# Patient Record
Sex: Male | Born: 1945 | Race: Black or African American | Hispanic: No | Marital: Single | Smoking: Current every day smoker
Health system: Southern US, Community
[De-identification: ages and names within clinical notes are randomized; demographics above are authoritative.]

## PROBLEM LIST (undated history)

## (undated) DIAGNOSIS — I10 Essential (primary) hypertension: Secondary | ICD-10-CM

## (undated) DIAGNOSIS — N400 Enlarged prostate without lower urinary tract symptoms: Secondary | ICD-10-CM

---

## 2012-03-26 ENCOUNTER — Encounter (HOSPITAL_COMMUNITY): Payer: Self-pay | Admitting: Pharmacy Technician

## 2012-03-30 NOTE — Patient Instructions (Addendum)
[address] Zeferino Mounts  03/30/2012   Your procedure is scheduled on:  04/07/12  Report to [facility] at 10:20 AM.  Call this number if you have problems the morning of surgery: [phone]   Remember:   Do not eat or drink:After Midnight.  Take these medicines the morning of surgery with A SIP OF WATER: Atenolol and Lisinopril   Do not wear jewelry, make-up or nail polish.  Do not wear lotions, powders, or perfumes.   Do not shave 48 hours prior to surgery. Men may shave face and neck.  Do not bring valuables to the hospital.  Contacts, dentures or bridgework may not be worn into surgery.  Leave suitcase in the car. After surgery it may be brought to your room.   Patients discharged the day of surgery will not be allowed to drive home.   Special Instructions: Shower using CHG 2 nights before surgery and the night before surgery.  If you shower the day of surgery use CHG.  Use special wash - you have one bottle of CHG for all showers.  You should use approximately 1/3 of the bottle for each shower.   Please read over the following fact sheets that you were given: Pain Booklet, MRSA Information, Surgical Site Infection Prevention, Anesthesia Post-op Instructions and Care and Recovery After Surgery    Transurethral Resection of the Prostate Transurethral Resection of the Prostate (TURP) is often treatment for non-cancerous (benign) prostatic hyperplasia (BPH) or prostate cancer. BPH commonly begins in middle aged men and can cause symptoms at any age thereafter. The complications that stem from these problems, such as recurrent infection and bladder control and emptying problems, are often helped by this procedure. Both conditions usually cause the prostate to increase in size. TURP is a major surgery that removes part of the prostate gland. The goal is to remove enough prostate to allow for unobstructed flow of urine. TREATMENT This surgery (TURP procedure) is done using an instrument like a narrow  telescope to look into your bladder. This is then used to remove enlarged pieces of your prostate, one piece at a time. This removes the blockage and makes it easier for you to urinate. This is called a transurethral resection of the prostate. A lesser procedure is sometimes done. In this procedure, small cuts are made in the prostate. This lessens the prostates pressure on the urethra. This is called a transurethral incision (cut by the surgeon) of the prostate (TUIP). You will probably be comfortable soon after the operation, but it will take 10 to 12 weeks, or longer, for your prostate to heal after you leave the hospital.  LET YOUR CAREGIVER KNOW ABOUT:  Allergies.  Medications taken including herbs, eye drops, over the counter medications, and creams.  Use of steroids (by mouth or creams).  Previous problems with anesthetics or Novocaine.  History of blood clots (thrombophlebitis).  History of bleeding or blood problems.  Previous surgery.  Previous prostate infections.  Other health problems. RISKS AND COMPLICATIONS  Rare injury to the bowel (intestine).  Rare injury to the bladder or adjacent blood vessels.  Intestinal/bowel obstruction.  Scarring called "stricture" that cause later problems with the flow of urine.  Bleeding and the need for blood transfusion (more common in those with prostate cancer and those who have previously received radiation therapy).  Inability to control your urine (incontinence). This is more common in those with prostate cancer and those who have received radiation therapy.  Injury to one of the ureters (  tubes that drain the kidneys into the bladder) and/or urethra (the tube that drains the bladder).  Injury to the capsule that holds the prostate. This can lead to leakage of fluid and urine into the belly (abdomen).  The operation can possibly lead to impotence. This is the inability to get an erection. Treatments are available for these  types of problems.  Rare over absorption of the fluids used during the operation. This can then cause low blood levels of sodium, brain swelling, strain on the heart, and fluid accumulation in the lungs. This is more common in long procedures.  Infection.  Blood clots in the legs.  As with any major surgery, there is always the rare chance of a complicating stroke, heart attack, or other complications that will be discussed with you by the surgeon and anesthesiologist. BEFORE THE PROCEDURE   You may be asked to temporarily adjust your diet. If so, your caregiver will give you specific recommendations.  If you are on blood thinners, stop taking them before the operation, or as your caregiver advises.  You should have nothing to eat or drink after midnight prior to your surgery or as suggested by your caregiver. You may have a sip of water to take medications not stopped for the procedure PROCEDURE  This operation is performed after you have been given a medication to help you sleep (anesthetic), or with a spinal block. The spinal block keeps you awake but numb from the waist down.  No cut or incision is needed. During the operation, your surgeon passes a viewing and cutting instrument (resectoscope) through the penis into the prostate gland. The instrument contains an electric cutting edge. From inside the prostate, this cutter is used to remove part of the prostate.  HOME CARE INSTRUCTIONS  For your own protection, observe the following precautions for 10 days after your operation.  You may go home with a catheter. Take care of it as directed. You will receive instruction on catheter care.  After catheter removal, empty the bladder whenever you feel a definite desire. Do not try to hold the urine for long periods of time.  For 10 days, avoid all lifting, straining, running, strenuous work, walks longer than a couple blocks, riding in a car for extended periods, and sexual relations.  Take  2 tablespoons of heavy mineral oil or Metamucil night and morning for 3 or 4 days. After that, gradually reduce the dose to one or two teaspoons twice daily. Stop it after the stools have been normal for a week. If you become constipated, do not strain to move your bowels. You may use an enema. Notify your caregiver about problems.  Even after complete healing, you may continue to urinate once or twice during the night.  In addition to your usual medications, you may be given an antibiotic to take for 10-14 days. Notify your caregiver if you have any side effects or problems with the medication.  Avoid alcohol and caffeinated drinks for 2 weeks, as they are irritating to the bladder. Decaffeinated drinks are fine.  Eat a regular diet, avoiding spicy foods for 2 weeks.  You may continue non-strenuous activities. It is always important to keep active after an operation. This lessens the chance of developing blood clots.  You may see some recurrence of blood in the urine after discharge from the hospital. Even a small amount of blood colors the urine very red. If this occurs, force fluids again as you did in the hospital. This  is generally not a concern. SEEK MEDICAL CARE IF:   You have chills or night sweats.  You are leaking around your catheter or have problems with your catheter.  You develop side effects that you think are coming from your medications. SEEK IMMEDIATE MEDICAL CARE IF:   You are suddenly unable to urinate. This is an emergency.  You develop shortness of breath or chest pains.  Bleeding persists or clots develop.  You have a fever.  You develop pain in your back or over your lower belly (abdomen).  You develop pain or swelling in your legs.  You develop swelling in your abdomen or have a sudden weight gain.  The problems get worse rather than better. Document Released: 05/20/2005 Document Revised: 08/12/2011 Document Reviewed: 10/21/2011 [facility] Patient  Information 2013 [place], [place].    PATIENT INSTRUCTIONS POST-ANESTHESIA  IMMEDIATELY FOLLOWING SURGERY:  Do not drive or operate machinery for the first twenty four hours after surgery.  Do not make any important decisions for twenty four hours after surgery or while taking narcotic pain medications or sedatives.  If you develop intractable nausea and vomiting or a severe headache please notify your doctor immediately.  FOLLOW-UP:  Please make an appointment with your surgeon as instructed. You do not need to follow up with anesthesia unless specifically instructed to do so.  WOUND CARE INSTRUCTIONS (if applicable):  Keep a dry clean dressing on the anesthesia/puncture wound site if there is drainage.  Once the wound has quit draining you may leave it open to air.  Generally you should leave the bandage intact for twenty four hours unless there is drainage.  If the epidural site drains for more than 36-48 hours please call the anesthesia department.  QUESTIONS?:  Please feel free to call your physician or the hospital operator if you have any questions, and they will be happy to assist you.

## 2012-03-31 ENCOUNTER — Encounter (HOSPITAL_COMMUNITY)
Admission: RE | Admit: 2012-03-31 | Discharge: 2012-03-31 | Disposition: A | Discharge status: Home / Self Care | Payer: Medicare Other | Source: Ambulatory Visit | Attending: Urology | Admitting: Urology

## 2012-03-31 ENCOUNTER — Encounter (HOSPITAL_COMMUNITY): Payer: Self-pay

## 2012-03-31 HISTORY — DX: Benign prostatic hyperplasia without lower urinary tract symptoms: N40.0

## 2012-03-31 HISTORY — DX: Essential (primary) hypertension: I10

## 2012-03-31 LAB — CBC WITH DIFFERENTIAL/PLATELET
Basophils Relative: 1 % (ref 0–1)
Eosinophils Absolute: 0.1 10*3/uL (ref 0.0–0.7)
MCH: 29.3 pg (ref 26.0–34.0)
MCHC: 33.7 g/dL (ref 30.0–36.0)
Monocytes Relative: 9 % (ref 3–12)
Neutrophils Relative %: 57 % (ref 43–77)
Platelets: 227 10*3/uL (ref 150–400)
RDW: 13.9 % (ref 11.5–15.5)

## 2012-03-31 LAB — BASIC METABOLIC PANEL
BUN: 18 mg/dL (ref 6–23)
GFR calc Af Amer: >90 mL/min (ref >90)
GFR calc non Af Amer: 85 mL/min — ABNORMAL LOW (ref >90)
Potassium: 4.1 mEq/L (ref 3.5–5.1)
Sodium: 139 mEq/L (ref 135–145)

## 2012-03-31 LAB — SURGICAL PCR SCREEN: MRSA, PCR: NEGATIVE

## 2012-04-06 NOTE — H&P (Signed)
[name], [name]               ACCOUNT NO.:  [id number]  MEDICAL RECORD NO.:  [id number]  LOCATION:                                 FACILITY:  PHYSICIAN:  Ky Barban, M.D.DATE OF BIRTH:  11-05-1945  DATE OF ADMISSION:  04/07/2012 DATE OF DISCHARGE:  [facility]                             HISTORY & PHYSICAL   CHIEF COMPLAINT:  Symptoms of prostatism.  HISTORY OF PRESENT ILLNESS:  A 66 year old gentleman who has voiding difficulty.  The patient's Dr. Rito Ehrlich who seen in March of this year. He has a history of having urethral stricture so I cystoscoped him in last month.  There is no recurrent of the urethral stricture, but he is very symptomatic.  He does have enlarged prostate for which I have advised him to undergo TUR prostate.  His residual urine was 62 mL. There is no history of dysuria, hematuria,  abdominal pain.  PAST MEDICAL HISTORY:  Includes hypertension, BPH with bladder neck obstruction, history of having urethral stricture.  PERSONAL HISTORY:  Does not smoke or drink.  REVIEW OF SYSTEMS:  Unremarkable.  PHYSICAL EXAMINATION:  GENERAL:  Moderately built male, not in acute distress. VITAL SIGNS:  Blood pressure is 120/80, temperature is normal. CENTRAL NERVOUS SYSTEM:  No gross neurological deficit. HEAD, NECK, EYES, ENT:  Negative. CHEST:  Symmetrical.  Normal breath sounds. HEART:  Regular sinus rhythm.  No murmur. ABDOMEN:  Soft, flat.  Liver, spleen, kidneys are not palpable.  No CVA tenderness. GU:  External genitalia is circumcised, meatus adequate.  Testicles are normal. RECTAL:  Normal sphincter tone.  No rectal mass.  Prostate 1.5+, smooth and firm.  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  The patient has been on Flomax 0.4 mg daily for some time, but he is not getting any better.  So I recommended undergo TUR prostate.  Procedure limitation, and complications were discussed.  He understands and want me to go  ahead and proceed.  He will be kept overnight in the hospital for observation.     Ky Barban, M.D.     MIJ/MEDQ  D:  04/06/2012  T:  04/06/2012  Job:  [id number]

## 2012-04-07 ENCOUNTER — Encounter (HOSPITAL_COMMUNITY): Payer: Self-pay | Admitting: Anesthesiology

## 2012-04-07 ENCOUNTER — Encounter (HOSPITAL_COMMUNITY): Payer: Self-pay | Admitting: *Deleted

## 2012-04-07 ENCOUNTER — Ambulatory Visit (HOSPITAL_COMMUNITY)
Admission: RE | Admit: 2012-04-07 | Discharge: 2012-04-08 | Disposition: A | Discharge status: Home / Self Care | Payer: Medicare Other | Source: Ambulatory Visit | Attending: Urology | Admitting: Urology

## 2012-04-07 ENCOUNTER — Ambulatory Visit (HOSPITAL_COMMUNITY): Payer: Medicare Other | Admitting: Anesthesiology

## 2012-04-07 ENCOUNTER — Encounter (HOSPITAL_COMMUNITY)
Admission: RE | Disposition: A | Discharge status: Home / Self Care | Payer: Self-pay | Source: Ambulatory Visit | Attending: Urology

## 2012-04-07 DIAGNOSIS — Z01810 Encounter for preprocedural cardiovascular examination: Secondary | ICD-10-CM | POA: Insufficient documentation

## 2012-04-07 DIAGNOSIS — N400 Enlarged prostate without lower urinary tract symptoms: Secondary | ICD-10-CM | POA: Insufficient documentation

## 2012-04-07 DIAGNOSIS — I10 Essential (primary) hypertension: Secondary | ICD-10-CM | POA: Insufficient documentation

## 2012-04-07 DIAGNOSIS — Z23 Encounter for immunization: Secondary | ICD-10-CM | POA: Insufficient documentation

## 2012-04-07 DIAGNOSIS — Z01812 Encounter for preprocedural laboratory examination: Secondary | ICD-10-CM | POA: Insufficient documentation

## 2012-04-07 HISTORY — PX: TRANSURETHRAL RESECTION OF PROSTATE: SHX73

## 2012-04-07 LAB — TYPE AND SCREEN

## 2012-04-07 SURGERY — TURP (TRANSURETHRAL RESECTION OF PROSTATE)
Anesthesia: Spinal | Site: Prostate | Wound class: Clean Contaminated

## 2012-04-07 MED ORDER — LACTATED RINGERS IV SOLN: INTRAVENOUS | Status: DC

## 2012-04-07 MED ORDER — ARTIFICIAL TEARS OP OINT
TOPICAL_OINTMENT | OPHTHALMIC | Status: AC
  Filled 2012-04-07: qty 3.5

## 2012-04-07 MED ORDER — LIDOCAINE HCL (PF) 1 % IJ SOLN
INTRAMUSCULAR | Status: AC
  Filled 2012-04-07: qty 5

## 2012-04-07 MED ORDER — STERILE WATER FOR IRRIGATION IR SOLN
Status: DC | PRN
  Administered 2012-04-07: 1000 mL

## 2012-04-07 MED ORDER — ONDANSETRON HCL 4 MG/2ML IJ SOLN: 4.0000 mg | Freq: Once | INTRAMUSCULAR | Status: DC | PRN

## 2012-04-07 MED ORDER — ATENOLOL 25 MG PO TABS
50.0000 mg | ORAL_TABLET | Freq: Every day | ORAL | Status: DC
  Administered 2012-04-08: 50 mg via ORAL
  Filled 2012-04-07: qty 2

## 2012-04-07 MED ORDER — MIDAZOLAM HCL 2 MG/2ML IJ SOLN
INTRAMUSCULAR | Status: AC
  Filled 2012-04-07: qty 2

## 2012-04-07 MED ORDER — HYDROMORPHONE HCL PF 1 MG/ML IJ SOLN: 1.0000 mg | INTRAMUSCULAR | Status: DC | PRN

## 2012-04-07 MED ORDER — PROPOFOL 10 MG/ML IV EMUL
INTRAVENOUS | Status: AC
  Filled 2012-04-07: qty 20

## 2012-04-07 MED ORDER — GLYCOPYRROLATE 0.2 MG/ML IJ SOLN
INTRAMUSCULAR | Status: DC | PRN
  Administered 2012-04-07: 0.2 mg via INTRAVENOUS

## 2012-04-07 MED ORDER — PROPOFOL INFUSION 10 MG/ML OPTIME
INTRAVENOUS | Status: DC | PRN
  Administered 2012-04-07: 75 ug/kg/min via INTRAVENOUS

## 2012-04-07 MED ORDER — FENTANYL CITRATE 0.05 MG/ML IJ SOLN
INTRAMUSCULAR | Status: DC | PRN
  Administered 2012-04-07 (×3): 25 ug via INTRAVENOUS
  Administered 2012-04-07: 25 ug via INTRATHECAL

## 2012-04-07 MED ORDER — BUPIVACAINE IN DEXTROSE 0.75-8.25 % IT SOLN
INTRATHECAL | Status: AC
  Filled 2012-04-07: qty 2

## 2012-04-07 MED ORDER — BUPIVACAINE HCL (PF) 0.75 % IJ SOLN
INTRAMUSCULAR | Status: DC | PRN
  Administered 2012-04-07: 2 mL

## 2012-04-07 MED ORDER — PNEUMOCOCCAL VAC POLYVALENT 25 MCG/0.5ML IJ INJ
0.5000 mL | INJECTION | INTRAMUSCULAR | Status: AC
  Administered 2012-04-08: 0.5 mL via INTRAMUSCULAR
  Filled 2012-04-07: qty 0.5

## 2012-04-07 MED ORDER — PHENYLEPHRINE HCL 10 MG/ML IJ SOLN
INTRAMUSCULAR | Status: DC | PRN
  Administered 2012-04-07: 100 ug via INTRAVENOUS

## 2012-04-07 MED ORDER — EPHEDRINE SULFATE 50 MG/ML IJ SOLN
INTRAMUSCULAR | Status: DC | PRN
  Administered 2012-04-07: 10 mg via INTRAVENOUS
  Administered 2012-04-07: 15 mg via INTRAVENOUS
  Administered 2012-04-07: 10 mg via INTRAVENOUS

## 2012-04-07 MED ORDER — ATROPINE SULFATE 0.4 MG/ML IJ SOLN
INTRAMUSCULAR | Status: AC
  Filled 2012-04-07: qty 1

## 2012-04-07 MED ORDER — ONDANSETRON HCL 4 MG/2ML IJ SOLN
INTRAMUSCULAR | Status: AC
  Filled 2012-04-07: qty 2

## 2012-04-07 MED ORDER — PHENYLEPHRINE HCL 10 MG/ML IJ SOLN
INTRAMUSCULAR | Status: AC
  Filled 2012-04-07: qty 1

## 2012-04-07 MED ORDER — ONDANSETRON HCL 4 MG/2ML IJ SOLN
4.0000 mg | Freq: Once | INTRAMUSCULAR | Status: AC
  Administered 2012-04-07: 4 mg via INTRAVENOUS

## 2012-04-07 MED ORDER — DEXTROSE-NACL 5-0.45 % IV SOLN
INTRAVENOUS | Status: DC
  Administered 2012-04-07 – 2012-04-08 (×3): via INTRAVENOUS

## 2012-04-07 MED ORDER — FENTANYL CITRATE 0.05 MG/ML IJ SOLN
INTRAMUSCULAR | Status: AC
  Filled 2012-04-07: qty 2

## 2012-04-07 MED ORDER — LACTATED RINGERS IV SOLN
INTRAVENOUS | Status: DC | PRN
  Administered 2012-04-07 (×3): via INTRAVENOUS
  Administered 2012-04-07: 1000 mL

## 2012-04-07 MED ORDER — SODIUM CHLORIDE 0.9 % IR SOLN
Status: DC | PRN
  Administered 2012-04-07: 3000 mL

## 2012-04-07 MED ORDER — FENTANYL CITRATE 0.05 MG/ML IJ SOLN: 25.0000 ug | INTRAMUSCULAR | Status: DC | PRN

## 2012-04-07 MED ORDER — LIDOCAINE HCL (CARDIAC) 10 MG/ML IV SOLN
INTRAVENOUS | Status: DC | PRN
  Administered 2012-04-07: 50 mg via INTRAVENOUS

## 2012-04-07 MED ORDER — GLYCINE 1.5 % IR SOLN
Status: DC | PRN
  Administered 2012-04-07 (×14): 3000 mL

## 2012-04-07 MED ORDER — MIDAZOLAM HCL 2 MG/2ML IJ SOLN
1.0000 mg | INTRAMUSCULAR | Status: DC | PRN
  Administered 2012-04-07: 2 mg via INTRAVENOUS

## 2012-04-07 MED ORDER — EPHEDRINE SULFATE 50 MG/ML IJ SOLN
INTRAMUSCULAR | Status: AC
  Filled 2012-04-07: qty 1

## 2012-04-07 SURGICAL SUPPLY — 30 items
BAG DECANTER FOR FLEXI CONT (MISCELLANEOUS) ×2 IMPLANT
BAG DRAIN URO TABLE W/ADPT NS (DRAPE) ×2 IMPLANT
BAG URINE DRAIN TURP 4L (OSTOMY) ×2 IMPLANT
CABLE HI FREQUENCY MONOPOLAR (ELECTROSURGICAL) ×4 IMPLANT
CATH FOLEY 3WAY 30CC 22F (CATHETERS) ×2 IMPLANT
CLOTH BEACON ORANGE TIMEOUT ST (SAFETY) ×2 IMPLANT
CONNECTOR 5 IN 1 STRAIGHT STRL (MISCELLANEOUS) ×2 IMPLANT
DRAPE STERI URO 23X35 APER SZ5 (DRAPE) ×2 IMPLANT
ELECT CUT LOOP C-MAX 27FR .012 (CUTTING LOOP) ×4
ELECT REM PT RETURN 9FT ADLT (ELECTROSURGICAL) ×2
ELECTRODE CUT LP CMX 27FR .012 (CUTTING LOOP) ×2 IMPLANT
ELECTRODE REM PT RTRN 9FT ADLT (ELECTROSURGICAL) ×1 IMPLANT
FLOOR PAD 36X40 (MISCELLANEOUS)
FORMALIN 10 PREFIL 480ML (MISCELLANEOUS) ×2 IMPLANT
GLOVE BIO SURGEON STRL SZ7 (GLOVE) ×2 IMPLANT
GLYCINE 1.5% IRRIG UROMATIC (IV SOLUTION) ×28 IMPLANT
GOWN STRL REIN XL XLG (GOWN DISPOSABLE) ×4 IMPLANT
IV NS IRRIG 3000ML ARTHROMATIC (IV SOLUTION) ×2 IMPLANT
KIT ROOM TURNOVER AP CYSTO (KITS) ×2 IMPLANT
MANIFOLD NEPTUNE II (INSTRUMENTS) ×2 IMPLANT
PACK CYSTO (CUSTOM PROCEDURE TRAY) ×2 IMPLANT
PAD ARMBOARD 7.5X6 YLW CONV (MISCELLANEOUS) ×2 IMPLANT
PAD FLOOR 36X40 (MISCELLANEOUS) IMPLANT
SET IRRIGATING DISP (SET/KITS/TRAYS/PACK) ×2 IMPLANT
SYR 30ML LL (SYRINGE) ×2 IMPLANT
SYRINGE IRR TOOMEY STRL 70CC (SYRINGE) ×2 IMPLANT
TOWEL OR 17X26 4PK STRL BLUE (TOWEL DISPOSABLE) ×2 IMPLANT
WATER STERILE IRR 1000ML POUR (IV SOLUTION) ×2 IMPLANT
XPEEDA 550 SIDEFIRING FIBER (MISCELLANEOUS) IMPLANT
YANKAUER SUCT BULB TIP 10FT TU (MISCELLANEOUS) ×2 IMPLANT

## 2012-04-07 NOTE — Anesthesia Postprocedure Evaluation (Addendum)
  Anesthesia Post-op Note  Patient: Anthony Goodwin  Procedure(s) Performed: Procedure(s) (LRB) with comments: TRANSURETHRAL RESECTION OF THE PROSTATE (TURP) (N/A)  Patient Location: PACU  Anesthesia Type: Spinal  Level of Consciousness: awake, alert , oriented and patient cooperative  Airway and Oxygen Therapy: Patient Spontanous Breathing room air  Post-op Pain: mild  Post-op Assessment: Post-op Vital signs reviewed, Patient's Cardiovascular Status Stable, Respiratory Function Stable, Patent Airway and No signs of Nausea or vomiting  Post-op Vital Signs: Reviewed and stable  Complications: No apparent anesthesia complications  04/08/12  Patient doing well.  VSS.  No HA or backache.  No apparent anesthesia complications.

## 2012-04-07 NOTE — Transfer of Care (Signed)
  Anesthesia Post-op Note  Patient: Anthony Goodwin  Procedure(s) Performed: Procedure(s) (LRB) with comments: TRANSURETHRAL RESECTION OF THE PROSTATE (TURP) (N/A)  Patient Location: PACU  Anesthesia Type:spinal  Level of Consciousness: awake, alert , oriented and patient cooperative  Airway and Oxygen Therapy: Patient Spontanous Breathing on room air Post-op Pain: mild  Post-op Assessment: Post-op Vital signs reviewed, Patient's Cardiovascular Status Stable, Respiratory Function Stable, Patent Airway and No signs of Nausea or vomiting  Post-op Vital Signs: Reviewed and stable  Complications: No apparent anesthesia complications

## 2012-04-07 NOTE — Preoperative (Signed)
Beta Blockers   Reason not to administer Beta Blockers:Not Applicable 

## 2012-04-07 NOTE — Progress Notes (Signed)
Awake. Coke given to drink. Tolerated well. 

## 2012-04-07 NOTE — Anesthesia Preprocedure Evaluation (Addendum)
Anesthesia Evaluation  Patient identified by MRN, date of birth, ID band Patient awake    Reviewed: Allergy & Precautions, H&P , NPO status , Patient's Chart, lab work & pertinent test results, reviewed documented beta blocker date and time   History of Anesthesia Complications Negative for: history of anesthetic complications  Airway Mallampati: I TM Distance: >3 FB     Dental  (+) Edentulous Upper and Edentulous Lower   Pulmonary Current Smoker,  breath sounds clear to auscultation        Cardiovascular hypertension, Pt. on medications     Neuro/Psych    GI/Hepatic negative GI ROS,   Endo/Other    Renal/GU      Musculoskeletal   Abdominal   Peds  Hematology   Anesthesia Other Findings   Reproductive/Obstetrics                          Anesthesia Physical Anesthesia Plan  ASA: II  Anesthesia Plan: Spinal   Post-op Pain Management:    Induction: Intravenous  Airway Management Planned: Nasal Cannula  Additional Equipment:   Intra-op Plan:   Post-operative Plan:   Informed Consent: I have reviewed the patients History and Physical, chart, labs and discussed the procedure including the risks, benefits and alternatives for the proposed anesthesia with the patient or authorized representative who has indicated his/her understanding and acceptance.     Plan Discussed with:   Anesthesia Plan Comments:         Anesthesia Quick Evaluation

## 2012-04-07 NOTE — Plan of Care (Signed)
Problem: Phase I Progression Outcomes Goal: Initial discharge plan identified Outcome: Completed/Met Date Met:  04/07/12 Plans to discharge home.

## 2012-04-07 NOTE — Progress Notes (Signed)
Temp 97.4 oral. bair hugger d/c'd. bair paws d/c'd. Warm blankets reapplied.

## 2012-04-07 NOTE — Progress Notes (Signed)
Pt condition called to care giver, Mrs. Amie Critchley. Voiced understanding.

## 2012-04-07 NOTE — Brief Op Note (Signed)
04/07/2012  1:56 PM  PATIENT:  Anthony Goodwin  66 y.o. male  PRE-OPERATIVE DIAGNOSIS:  benign prostate hypertrophy  POST-OPERATIVE DIAGNOSIS:  benign prostate hypertrophy  PROCEDURE:  Procedure(s) (LRB) with comments: TRANSURETHRAL RESECTION OF THE PROSTATE (TURP) (N/A)  SURGEON:  Surgeon(s) and Role:    * Ky Barban, MD - Primary  PHYSICIAN ASSISTANT:   ASSISTANTS: none   ANESTHESIA:   spinal  EBL:  Total I/O In: 1500 [I.V.:1500] Out: -   BLOOD ADMINISTERED:noblood given CC PRBC  DRAINS: Urinary Catheter (Foley)   LOCAL MEDICATIONS USED:  NONE  SPECIMEN:  Source of Specimen:  prostate chips  DISPOSITION OF SPECIMEN:  PATHOLOGY  COUNTS:  YES  TOURNIQUET:  * No tourniquets in log *  DICTATION: .Other Dictation: Dictation Number [id number]  PLAN OF CARE: Admit to inpatient   PATIENT DISPOSITION:  PACU - hemodynamically stable.   Delay start of Pharmacological VTE agent (>24hrs) due to surgical blood loss or risk of bleeding:

## 2012-04-07 NOTE — Progress Notes (Signed)
No change in H&P on reexamination.pro 

## 2012-04-07 NOTE — Progress Notes (Signed)
Awake. Sensation level at bilateral toes. Moves legs without difficulty. Temp 35.6 temporal=96.1. Bair hugger applied. Tolerated well.

## 2012-04-07 NOTE — Anesthesia Procedure Notes (Addendum)
Performed by: Corena Pilgrim L Patient Re-evaluated:Patient Re-evaluated prior to inductionOxygen Delivery Method: Non-rebreather mask   Spinal  Patient location during procedure: OR Start time: 04/07/2012 12:10 PM Staffing Anesthesiologist: Laurene Footman CRNA/Resident: ANDRAZA, Jessica Seidman L Preanesthetic Checklist Completed: patient identified, site marked, surgical consent, pre-op evaluation, timeout performed, IV checked, risks and benefits discussed and monitors and equipment checked Spinal Block Patient position: right lateral decubitus Prep: Betadine Patient monitoring: heart rate, cardiac monitor, continuous pulse ox and blood pressure Approach: right paramedian Location: L3-4 Injection technique: single-shot Needle Needle type: Spinocan  Needle gauge: 22 G Needle length: 9 cm Assessment Sensory level: T8 Additional Notes ATTEMPTS:2 TRAY UU:[id number] TRAY EXPIRATION DATE:05/2013 Attempt spinal x1 by Carolyne Littles; Spinal placed at 1210 by Dr. Marcos Eke; Marcaine .75% 2cc and Fentanyl 25 mcg injected intrathecally

## 2012-04-08 LAB — CBC
HCT: 37.8 % — ABNORMAL LOW (ref 39.0–52.0)
Hemoglobin: 12.7 g/dL — ABNORMAL LOW (ref 13.0–17.0)
RBC: 4.32 MIL/uL (ref 4.22–5.81)
WBC: 7.3 10*3/uL (ref 4.0–10.5)

## 2012-04-08 LAB — BASIC METABOLIC PANEL
BUN: 11 mg/dL (ref 6–23)
Chloride: 107 mEq/L (ref 96–112)
GFR calc Af Amer: >90 mL/min (ref >90)
GFR calc non Af Amer: 88 mL/min — ABNORMAL LOW (ref >90)
Glucose, Bld: 102 mg/dL — ABNORMAL HIGH (ref 70–99)
Potassium: 3.4 mEq/L — ABNORMAL LOW (ref 3.5–5.1)
Sodium: 140 mEq/L (ref 135–145)

## 2012-04-08 MED ORDER — SULFAMETHOXAZOLE-TRIMETHOPRIM 800-160 MG PO TABS: 1.0000 | ORAL_TABLET | Freq: Two times a day (BID) | ORAL | Status: AC

## 2012-04-08 MED ORDER — ATENOLOL 50 MG PO TABS: 50.0000 mg | ORAL_TABLET | Freq: Every day | ORAL | Status: DC

## 2012-04-08 NOTE — Progress Notes (Signed)
Patient received discharge instructions along with follow up appointments and prescription. Patient verbalized understanding of all instructions. Patient was escorted by staff via wheelchair to vehicle. Patient discharged to assisted living facility in stable condition.

## 2012-04-08 NOTE — Addendum Note (Signed)
Addendum  created 04/08/12 1217 by Moshe Salisbury, CRNA   Modules edited:Notes Section

## 2012-04-08 NOTE — Progress Notes (Signed)
UR Chart Review Completed  

## 2012-04-08 NOTE — Clinical Social Work Note (Signed)
CSW spoke with [facility] at [facility] regarding potential d/c this afternoon. Rhunette Croft is agreeable to return with d/c summary and AVS but no FL2 due to <24 hour admission. Facility available to transport pt at d/c. RN notified of above and will provide paperwork at d/c.  Derenda Fennel, LCSW [phone]

## 2012-04-08 NOTE — Progress Notes (Signed)
Afebrile no complaints. Temp98.3 bp110/62 cbi dc urine sightly pinkish  plan dc home with catheter

## 2012-04-08 NOTE — Op Note (Signed)
NAMEJOREN, REHM               ACCOUNT NO.:  [id number]  MEDICAL RECORD NO.:  [id number]  LOCATION:  A304                          FACILITY:  [facility]  PHYSICIAN:  Ky Barban, M.D.DATE OF BIRTH:  Dec 31, 1945  DATE OF PROCEDURE:  04/07/2012 DATE OF DISCHARGE:                              OPERATIVE REPORT   PREOPERATIVE DIAGNOSIS:  Benign prostatic hyperplasia.  POSTOPERATIVE DIAGNOSIS:  Benign prostatic hyperplasia.  PROCEDURE:  Transurethral resection of prostate.  ANESTHESIA:  Spinal.  DESCRIPTION OF PROCEDURE:  The patient under spinal anesthesia in lithotomy position.  After usual prep and drape, a #28 Iglesias resectoscope was introduced into the bladder.  The median lobe which was very small was resected completely up to the level of the verumontanum. Then, the bladder neck was circumferentially resected down to the circular fibers.  Dissection of the right lobe was started between 11 and 7 o'clock position.  Most of the adenoma has been resected, but there was more tissue than what I expected on this side, but there was larger tissue on the left side.  I resected the left lobe, but there was still considerable tissue in the left side especially in the anterior midline around 12 o'clock position on the bladder neck.  There was more tissue than I expected.  There was hardly any tissue in the posterior midline.  The prostatic urethra looked open at this point with some residual tissue on both the lateral lobes and the anterior midline.  It was not causing any obstruction.  Chips were evacuated.  Bleeders were coagulated.  Resectoscope was removed.  CBI was clear.  The patient left the operating room in satisfactory condition.  I have inserted #22 three-way Foley catheter with 30 mL balloon.     Ky Barban, M.D.     MIJ/MEDQ  D:  04/07/2012  T:  04/08/2012  Job:  [id number]

## 2012-04-09 ENCOUNTER — Encounter (HOSPITAL_COMMUNITY): Payer: Self-pay | Admitting: Urology

## 2012-04-09 NOTE — Discharge Summary (Signed)
Anthony Goodwin, Anthony Goodwin               ACCOUNT NO.:  [id number]  MEDICAL RECORD NO.:  [id number]  LOCATION:  A304                          FACILITY:  [facility]  PHYSICIAN:  Ky Barban, M.D.DATE OF BIRTH:  04-11-1946  DATE OF ADMISSION:  04/07/2012 DATE OF DISCHARGE:  11/06/2013LH                              DISCHARGE SUMMARY   [name] is a 66 year old gentleman has longstanding history of prostatism.  He has been on Flomax for long time.  Recently he became more symptomatic, has large residual urine, so I decided to do TUR prostate.  He was brought as outpatient to undergo TUR prostate under anesthesia which was done yesterday.  He has a rather large prostate, and after his TURP is doing fine.  He is afebrile this morning.  I have discontinued his CBI which is clear and this afternoon he continued to have slightly pinkish urine.  Otherwise he is afebrile.  Abdomen is soft, nondistended.  His pathology report is still pending.  I also mentioned the patient has a history of having urethral stricture but now on cystoscopy, he was found to be just enlarged prostate.  No recurrence of the stricture.  He does have hypertension for which he takes a medication which is well under control.  So at this point, his urine is pinkish, I am going to discharge him home on is preoperative medications except aspirin which I have told him to start taking and I will see him back on Monday.  He is being discharged with the Foley catheter.  I have given the instruction if the urine becomes bloodier to let me know or if he spikes any temperature over 101, let me know.  I am going to put him on Bactrim DS 1 p.o. b.i.d. for 7 days.  FINAL DISCHARGE DIAGNOSIS:  Pathology is pending, but I think he has benign prostatic hypertrophy.  DISCHARGE MEDICATION:  He is to continue taking his medicines along with Flomax 0.4 mg 1 a day, and he is on Tenormin which I am going to discontinue that and instead he is  already taking his blood pressure medicines lisinopril.  I will put him on Bactrim DS 1 p.o. b.i.d. for 7 days.     Ky Barban, M.D.     MIJ/MEDQ  D:  04/08/2012  T:  04/09/2012  Job:  [id number]

## 2012-04-29 ENCOUNTER — Emergency Department (HOSPITAL_COMMUNITY)
Admission: EM | Admit: 2012-04-29 | Discharge: 2012-04-29 | Disposition: A | Discharge status: Home / Self Care | Payer: Medicare Other | Attending: Emergency Medicine | Admitting: Emergency Medicine

## 2012-04-29 ENCOUNTER — Encounter (HOSPITAL_COMMUNITY): Payer: Self-pay | Admitting: *Deleted

## 2012-04-29 DIAGNOSIS — N400 Enlarged prostate without lower urinary tract symptoms: Secondary | ICD-10-CM | POA: Insufficient documentation

## 2012-04-29 DIAGNOSIS — N390 Urinary tract infection, site not specified: Secondary | ICD-10-CM

## 2012-04-29 DIAGNOSIS — Z79899 Other long term (current) drug therapy: Secondary | ICD-10-CM | POA: Insufficient documentation

## 2012-04-29 DIAGNOSIS — F17200 Nicotine dependence, unspecified, uncomplicated: Secondary | ICD-10-CM | POA: Insufficient documentation

## 2012-04-29 DIAGNOSIS — I10 Essential (primary) hypertension: Secondary | ICD-10-CM | POA: Insufficient documentation

## 2012-04-29 LAB — URINE MICROSCOPIC-ADD ON

## 2012-04-29 LAB — URINALYSIS, ROUTINE W REFLEX MICROSCOPIC
Bilirubin Urine: NEGATIVE
Nitrite: NEGATIVE
Specific Gravity, Urine: 1.025 (ref 1.005–1.030)
Urobilinogen, UA: 2 mg/dL — ABNORMAL HIGH (ref 0.0–1.0)
pH: 6 (ref 5.0–8.0)

## 2012-04-29 MED ORDER — CIPROFLOXACIN HCL 500 MG PO TABS: 500.0000 mg | ORAL_TABLET | Freq: Two times a day (BID) | ORAL | Status: AC

## 2012-04-29 NOTE — ED Provider Notes (Signed)
History   This chart was scribed for Benny Lennert, MD by Gerlean Ren, ED Scribe. This patient was seen in room APA16A/APA16A and the patient's care was started at 1:28 PM    CSN: [id number]  Arrival date & time 04/29/12  1202   First MD Initiated Contact with Patient 04/29/12 1322      Chief Complaint  Patient presents with  . Hematuria     The history is provided by the patient. No language interpreter was used.   Anthony LULA MICHAUX is a 66 y.o. male with h/o HTN and BPH who presents to the Emergency Department blood was found in his urine this morning from a sample taken at [facility] 11/19.  Pt lives at [facility].  Pt currently has no complaints.  Pt had prostate surgery 11/5. Pt is a current everyday smoker but denies alcohol use.   Past Medical History  Diagnosis Date  . Hypertension   . BPH (benign prostatic hyperplasia)     Past Surgical History  Procedure Date  . Transurethral resection of prostate 04/07/2012    Procedure: TRANSURETHRAL RESECTION OF THE PROSTATE (TURP);  Surgeon: Ky Barban, MD;  Location: [facility];  Service: Urology;  Laterality: N/A;    History reviewed. No pertinent family history.  History  Substance Use Topics  . Smoking status: Current Every Day Smoker -- 0.5 packs/day for 40 years    Types: Cigarettes  . Smokeless tobacco: Never Used  . Alcohol Use: No      Review of Systems  Constitutional: Negative for fatigue.  HENT: Negative for congestion, sinus pressure and ear discharge.   Eyes: Negative for discharge.  Respiratory: Negative for cough.   Cardiovascular: Negative for chest pain.  Gastrointestinal: Negative for diarrhea.  Musculoskeletal: Negative for back pain.  Skin: Negative for rash.  Neurological: Negative for seizures and headaches.  Hematological: Negative.   Psychiatric/Behavioral: Negative for hallucinations.    Allergies  Penicillins  Home Medications   Current Outpatient Rx  Name   Route  Sig  Dispense  Refill  . ATENOLOL 50 MG PO TABS   Oral   Take 50 mg by mouth daily.         . ATENOLOL 50 MG PO TABS   Oral   Take 1 tablet (50 mg total) by mouth daily.   30 tablet   0   . HYDROCHLOROTHIAZIDE 25 MG PO TABS   Oral   Take 25 mg by mouth daily.         Marland Kitchen LISINOPRIL 40 MG PO TABS   Oral   Take 40 mg by mouth daily.         Marland Kitchen LORATADINE 10 MG PO TABS   Oral   Take 10 mg by mouth daily.         . SULFAMETHOXAZOLE-TRIMETHOPRIM 800-160 MG PO TABS   Oral   Take 1 tablet by mouth 2 (two) times daily.   14 tablet   0   . TAMSULOSIN HCL 0.4 MG PO CAPS   Oral   Take 0.4 mg by mouth.           BP 114/77  Pulse 67  Temp 98.3 F (36.8 C) (Oral)  Resp 20  Ht 6\' 3"  (1.905 m)  Wt 171 lb (77.565 kg)  BMI 21.37 kg/m2  SpO2 98%  Physical Exam  Nursing note and vitals reviewed. Constitutional: He is oriented to person, place, and time. He appears well-developed.  HENT:  Head: Normocephalic and atraumatic.  Eyes: Conjunctivae normal and EOM are normal. No scleral icterus.  Neck: Neck supple. No thyromegaly present.  Cardiovascular: Normal rate and regular rhythm.  Exam reveals no gallop and no friction rub.   No murmur heard. Pulmonary/Chest: No stridor. He has no wheezes. He has no rales. He exhibits no tenderness.  Abdominal: He exhibits no distension. There is no tenderness. There is no rebound.  Musculoskeletal: Normal range of motion. He exhibits no edema.  Lymphadenopathy:    He has no cervical adenopathy.  Neurological: He is oriented to person, place, and time. Coordination normal.  Skin: No rash noted. No erythema.  Psychiatric: He has a normal mood and affect. His behavior is normal.    ED Course  Procedures (including critical care time) DIAGNOSTIC STUDIES: Oxygen Saturation is 98% on room air, normal by my interpretation.    COORDINATION OF CARE: 1:31 PM- Patient informed of clinical course, understands medical  decision-making process, and agrees with plan.  Ordered urinalysis. 2:13 PM- Informed pt of bladder infection.  Discussed antibiotics and follow up with PCP.  Labs Reviewed - No data to display. Results for orders placed during the hospital encounter of 04/29/12  URINALYSIS, ROUTINE W REFLEX MICROSCOPIC      Component Value Range   Color, Urine YELLOW  YELLOW   APPearance CLEAR  CLEAR   Specific Gravity, Urine 1.025  1.005 - 1.030   pH 6.0  5.0 - 8.0   Glucose, UA NEGATIVE  NEGATIVE mg/dL   Hgb urine dipstick LARGE (*) NEGATIVE   Bilirubin Urine NEGATIVE  NEGATIVE   Ketones, ur NEGATIVE  NEGATIVE mg/dL   Protein, ur 30 (*) NEGATIVE mg/dL   Urobilinogen, UA 2.0 (*) 0.0 - 1.0 mg/dL   Nitrite NEGATIVE  NEGATIVE   Leukocytes, UA MODERATE (*) NEGATIVE  URINE MICROSCOPIC-ADD ON      Component Value Range   WBC, UA TOO NUMEROUS TO COUNT  <3 WBC/hpf   RBC / HPF 21-50  <3 RBC/hpf   Bacteria, UA FEW (*) RARE    No results found.   No diagnosis found.    MDM   The chart was scribed for me under my direct supervision.  I personally performed the history, physical, and medical decision making and all procedures in the evaluation of this patient.Benny Lennert, MD 04/29/12 [phone]

## 2012-04-29 NOTE — ED Notes (Signed)
Had prostate surgery  11/5 due to urinary retention.  Seen by MD on 11/19   And was called  By [facility] this am and told to come to Er due to hematuria, Pt is resident at group home and [facility].  Pt has no c/o

## 2012-04-30 LAB — URINE CULTURE

## 2014-01-11 ENCOUNTER — Ambulatory Visit: Payer: Self-pay | Admitting: Unknown Physician Specialty

## 2014-01-26 ENCOUNTER — Ambulatory Visit: Payer: Self-pay | Admitting: Unknown Physician Specialty

## 2014-02-01 ENCOUNTER — Ambulatory Visit: Payer: Self-pay | Admitting: Oncology

## 2014-02-01 LAB — CBC CANCER CENTER
BASOS ABS: 0 x10 3/mm (ref 0.0–0.1)
Basophil %: 0.6 %
EOS ABS: 0.1 x10 3/mm (ref 0.0–0.7)
Eosinophil %: 2 %
HCT: 44.1 % (ref 40.0–52.0)
HGB: 14.4 g/dL (ref 13.0–18.0)
LYMPHS PCT: 32.8 %
Lymphocyte #: 2 x10 3/mm (ref 1.0–3.6)
MCH: 29.2 pg (ref 26.0–34.0)
MCHC: 32.6 g/dL (ref 32.0–36.0)
MCV: 90 fL (ref 80–100)
MONOS PCT: 9.6 %
Monocyte #: 0.6 x10 3/mm (ref 0.2–1.0)
NEUTROS ABS: 3.4 x10 3/mm (ref 1.4–6.5)
Neutrophil %: 55 %
Platelet: 275 x10 3/mm (ref 150–440)
RBC: 4.92 10*6/uL (ref 4.40–5.90)
RDW: 13.6 % (ref 11.5–14.5)
WBC: 6.2 x10 3/mm (ref 3.8–10.6)

## 2014-02-01 LAB — COMPREHENSIVE METABOLIC PANEL
ALT: 24 U/L
ANION GAP: 5 — AB (ref 7–16)
AST: 25 U/L (ref 15–37)
Albumin: 3.5 g/dL (ref 3.4–5.0)
Alkaline Phosphatase: 77 U/L
BILIRUBIN TOTAL: 0.5 mg/dL (ref 0.2–1.0)
BUN: 18 mg/dL (ref 7–18)
CHLORIDE: 106 mmol/L (ref 98–107)
CO2: 32 mmol/L (ref 21–32)
CREATININE: 1.38 mg/dL — AB (ref 0.60–1.30)
Calcium, Total: 9.2 mg/dL (ref 8.5–10.1)
GFR CALC NON AF AMER: 53 — AB
Glucose: 102 mg/dL — ABNORMAL HIGH (ref 65–99)
Osmolality: 287 (ref 275–301)
POTASSIUM: 3.9 mmol/L (ref 3.5–5.1)
Sodium: 143 mmol/L (ref 136–145)
Total Protein: 7.1 g/dL (ref 6.4–8.2)

## 2014-02-01 LAB — PROTIME-INR
INR: 1
Prothrombin Time: 12.8 secs (ref 11.5–14.7)

## 2014-02-01 LAB — APTT: ACTIVATED PTT: 25.2 s (ref 23.6–35.9)

## 2014-02-09 ENCOUNTER — Ambulatory Visit: Payer: Self-pay | Admitting: Vascular Surgery

## 2014-02-14 LAB — CBC CANCER CENTER
Basophil #: 0 x10 3/mm (ref 0.0–0.1)
Basophil %: 0.5 %
EOS PCT: 1.4 %
Eosinophil #: 0.1 x10 3/mm (ref 0.0–0.7)
HCT: 44.4 % (ref 40.0–52.0)
HGB: 14.5 g/dL (ref 13.0–18.0)
Lymphocyte #: 1.6 x10 3/mm (ref 1.0–3.6)
Lymphocyte %: 26.2 %
MCH: 29.2 pg (ref 26.0–34.0)
MCHC: 32.6 g/dL (ref 32.0–36.0)
MCV: 90 fL (ref 80–100)
Monocyte #: 0.6 x10 3/mm (ref 0.2–1.0)
Monocyte %: 9.5 %
NEUTROS ABS: 3.7 x10 3/mm (ref 1.4–6.5)
Neutrophil %: 62.4 %
PLATELETS: 240 x10 3/mm (ref 150–440)
RBC: 4.96 10*6/uL (ref 4.40–5.90)
RDW: 13.5 % (ref 11.5–14.5)
WBC: 6 x10 3/mm (ref 3.8–10.6)

## 2014-02-14 LAB — COMPREHENSIVE METABOLIC PANEL
ALT: 25 U/L
ANION GAP: 7 (ref 7–16)
AST: 28 U/L (ref 15–37)
Albumin: 3.5 g/dL (ref 3.4–5.0)
Alkaline Phosphatase: 83 U/L
BUN: 13 mg/dL (ref 7–18)
Bilirubin,Total: 0.6 mg/dL (ref 0.2–1.0)
CALCIUM: 9.4 mg/dL (ref 8.5–10.1)
CHLORIDE: 104 mmol/L (ref 98–107)
CO2: 30 mmol/L (ref 21–32)
CREATININE: 1.16 mg/dL (ref 0.60–1.30)
EGFR (African American): >60
Glucose: 100 mg/dL — ABNORMAL HIGH (ref 65–99)
Osmolality: 281 (ref 275–301)
Potassium: 4.4 mmol/L (ref 3.5–5.1)
Sodium: 141 mmol/L (ref 136–145)
TOTAL PROTEIN: 7.1 g/dL (ref 6.4–8.2)

## 2014-02-21 LAB — CBC CANCER CENTER
Basophil #: 0 "x10 3/mm "
Basophil %: 0.6 %
Eosinophil #: 0 "x10 3/mm "
Eosinophil %: 4.5 %
HCT: 41.8 %
HGB: 13.6 g/dL
Lymphocyte %: 81.8 %
Lymphs Abs: 0.8 "x10 3/mm " — ABNORMAL LOW
MCH: 28.7 pg
MCHC: 32.4 g/dL
MCV: 89 fL
Monocyte #: 0 "x10 3/mm " — ABNORMAL LOW
Monocyte %: 3.7 %
Neutrophil #: 0.1 "x10 3/mm " — ABNORMAL LOW
Neutrophil %: 9.4 %
Platelet: 156 "x10 3/mm "
RBC: 4.72 "x10 6/mm "
RDW: 13.5 %
WBC: 1 "x10 3/mm " — CL

## 2014-02-28 LAB — CBC CANCER CENTER
BASOS PCT: 0.2 %
Basophil #: 0 x10 3/mm (ref 0.0–0.1)
EOS ABS: 0 x10 3/mm (ref 0.0–0.7)
EOS PCT: 0 %
HCT: 42.9 % (ref 40.0–52.0)
HGB: 13.6 g/dL (ref 13.0–18.0)
LYMPHS ABS: 1.5 x10 3/mm (ref 1.0–3.6)
LYMPHS PCT: 5.9 %
MCH: 27.9 pg (ref 26.0–34.0)
MCHC: 31.8 g/dL — AB (ref 32.0–36.0)
MCV: 88 fL (ref 80–100)
Monocyte #: 0.8 x10 3/mm (ref 0.2–1.0)
Monocyte %: 3.3 %
NEUTROS ABS: 23.4 x10 3/mm — AB (ref 1.4–6.5)
NEUTROS PCT: 90.6 %
PLATELETS: 174 x10 3/mm (ref 150–440)
RBC: 4.89 10*6/uL (ref 4.40–5.90)
RDW: 13.6 % (ref 11.5–14.5)
WBC: 25.8 x10 3/mm — AB (ref 3.8–10.6)

## 2014-03-03 ENCOUNTER — Ambulatory Visit: Payer: Self-pay | Admitting: Oncology

## 2014-03-07 LAB — CBC CANCER CENTER
BASOS PCT: 0.6 %
Basophil #: 0.1 x10 3/mm (ref 0.0–0.1)
EOS PCT: 0.1 %
Eosinophil #: 0 x10 3/mm (ref 0.0–0.7)
HCT: 38.2 % — AB (ref 40.0–52.0)
HGB: 12.5 g/dL — AB (ref 13.0–18.0)
Lymphocyte #: 2.2 x10 3/mm (ref 1.0–3.6)
Lymphocyte %: 12.4 %
MCH: 28.7 pg (ref 26.0–34.0)
MCHC: 32.7 g/dL (ref 32.0–36.0)
MCV: 88 fL (ref 80–100)
MONO ABS: 1.1 x10 3/mm — AB (ref 0.2–1.0)
Monocyte %: 6 %
NEUTROS ABS: 14.3 x10 3/mm — AB (ref 1.4–6.5)
Neutrophil %: 80.9 %
PLATELETS: 319 x10 3/mm (ref 150–440)
RBC: 4.35 10*6/uL — ABNORMAL LOW (ref 4.40–5.90)
RDW: 14.3 % (ref 11.5–14.5)
WBC: 17.7 x10 3/mm — ABNORMAL HIGH (ref 3.8–10.6)

## 2014-03-14 LAB — COMPREHENSIVE METABOLIC PANEL
ALK PHOS: 129 U/L — AB
ALT: 26 U/L
ANION GAP: 8 (ref 7–16)
AST: 34 U/L (ref 15–37)
Albumin: 2.4 g/dL — ABNORMAL LOW (ref 3.4–5.0)
BUN: 11 mg/dL (ref 7–18)
Bilirubin,Total: 0.4 mg/dL (ref 0.2–1.0)
CHLORIDE: 107 mmol/L (ref 98–107)
CO2: 26 mmol/L (ref 21–32)
CREATININE: 1.19 mg/dL (ref 0.60–1.30)
Calcium, Total: 8.4 mg/dL — ABNORMAL LOW (ref 8.5–10.1)
Glucose: 120 mg/dL — ABNORMAL HIGH (ref 65–99)
Osmolality: 282 (ref 275–301)
Potassium: 2.9 mmol/L — ABNORMAL LOW (ref 3.5–5.1)
SODIUM: 141 mmol/L (ref 136–145)
TOTAL PROTEIN: 5.7 g/dL — AB (ref 6.4–8.2)

## 2014-03-14 LAB — CBC CANCER CENTER
BASOS ABS: 0.1 x10 3/mm (ref 0.0–0.1)
Basophil %: 0.9 %
EOS ABS: 0 x10 3/mm (ref 0.0–0.7)
EOS PCT: 0.3 %
HCT: 36.4 % — ABNORMAL LOW (ref 40.0–52.0)
HGB: 11.8 g/dL — AB (ref 13.0–18.0)
LYMPHS ABS: 1.2 x10 3/mm (ref 1.0–3.6)
Lymphocyte %: 17.3 %
MCH: 28.9 pg (ref 26.0–34.0)
MCHC: 32.4 g/dL (ref 32.0–36.0)
MCV: 89 fL (ref 80–100)
Monocyte #: 0.8 x10 3/mm (ref 0.2–1.0)
Monocyte %: 11.8 %
Neutrophil #: 4.8 x10 3/mm (ref 1.4–6.5)
Neutrophil %: 69.7 %
PLATELETS: 210 x10 3/mm (ref 150–440)
RBC: 4.08 10*6/uL — ABNORMAL LOW (ref 4.40–5.90)
RDW: 14.6 % — ABNORMAL HIGH (ref 11.5–14.5)
WBC: 6.9 x10 3/mm (ref 3.8–10.6)

## 2014-03-14 LAB — MAGNESIUM: Magnesium: 1.4 mg/dL — ABNORMAL LOW

## 2014-03-21 LAB — CBC CANCER CENTER
BASOS ABS: 0 x10 3/mm (ref 0.0–0.1)
BASOS PCT: 1.8 %
EOS ABS: 0 x10 3/mm (ref 0.0–0.7)
EOS PCT: 2.8 %
HCT: 37.1 % — AB (ref 40.0–52.0)
HGB: 11.9 g/dL — AB (ref 13.0–18.0)
Lymphocyte #: 0.7 x10 3/mm — ABNORMAL LOW (ref 1.0–3.6)
Lymphocyte %: 73.7 %
MCH: 28.5 pg (ref 26.0–34.0)
MCHC: 32 g/dL (ref 32.0–36.0)
MCV: 89 fL (ref 80–100)
Monocyte #: 0.1 x10 3/mm — ABNORMAL LOW (ref 0.2–1.0)
Monocyte %: 11.9 %
Neutrophil #: 0.1 x10 3/mm — ABNORMAL LOW (ref 1.4–6.5)
Neutrophil %: 9.8 %
PLATELETS: 70 x10 3/mm — AB (ref 150–440)
RBC: 4.17 10*6/uL — ABNORMAL LOW (ref 4.40–5.90)
RDW: 14.3 % (ref 11.5–14.5)
WBC: 0.9 x10 3/mm — CL (ref 3.8–10.6)

## 2014-03-28 LAB — CBC CANCER CENTER
Bands: 5 %
HCT: 35.1 % — ABNORMAL LOW (ref 40.0–52.0)
HGB: 11.5 g/dL — ABNORMAL LOW (ref 13.0–18.0)
Lymphocytes: 9 %
MCH: 28.4 pg (ref 26.0–34.0)
MCHC: 32.8 g/dL (ref 32.0–36.0)
MCV: 87 fL (ref 80–100)
Metamyelocyte: 1 %
Monocytes: 4 %
Myelocyte: 1 %
Platelet: 175 x10 3/mm (ref 150–440)
RBC: 4.05 10*6/uL — ABNORMAL LOW (ref 4.40–5.90)
RDW: 14.9 % — ABNORMAL HIGH (ref 11.5–14.5)
Segmented Neutrophils: 80 %
WBC: 30.4 x10 3/mm — ABNORMAL HIGH (ref 3.8–10.6)

## 2014-04-03 ENCOUNTER — Ambulatory Visit: Payer: Self-pay | Admitting: Oncology

## 2014-04-05 LAB — CBC CANCER CENTER
Basophil #: 0.1 x10 3/mm (ref 0.0–0.1)
Basophil %: 0.8 %
EOS ABS: 0 x10 3/mm (ref 0.0–0.7)
Eosinophil %: 0 %
HCT: 32.5 % — AB (ref 40.0–52.0)
HGB: 10.6 g/dL — AB (ref 13.0–18.0)
LYMPHS PCT: 16.4 %
Lymphocyte #: 1.9 x10 3/mm (ref 1.0–3.6)
MCH: 28.5 pg (ref 26.0–34.0)
MCHC: 32.6 g/dL (ref 32.0–36.0)
MCV: 88 fL (ref 80–100)
MONO ABS: 1.2 x10 3/mm — AB (ref 0.2–1.0)
Monocyte %: 10.3 %
NEUTROS ABS: 8.6 x10 3/mm — AB (ref 1.4–6.5)
Neutrophil %: 72.5 %
Platelet: 194 x10 3/mm (ref 150–440)
RBC: 3.71 10*6/uL — AB (ref 4.40–5.90)
RDW: 15.5 % — ABNORMAL HIGH (ref 11.5–14.5)
WBC: 11.9 x10 3/mm — ABNORMAL HIGH (ref 3.8–10.6)

## 2014-04-05 LAB — COMPREHENSIVE METABOLIC PANEL
ALBUMIN: 2.3 g/dL — AB (ref 3.4–5.0)
AST: 32 U/L (ref 15–37)
Alkaline Phosphatase: 177 U/L — ABNORMAL HIGH
Anion Gap: 5 — ABNORMAL LOW (ref 7–16)
BUN: 17 mg/dL (ref 7–18)
Bilirubin,Total: 0.5 mg/dL (ref 0.2–1.0)
CREATININE: 1.35 mg/dL — AB (ref 0.60–1.30)
Calcium, Total: 7.8 mg/dL — ABNORMAL LOW (ref 8.5–10.1)
Chloride: 105 mmol/L (ref 98–107)
Co2: 31 mmol/L (ref 21–32)
GFR CALC NON AF AMER: 56 — AB
GLUCOSE: 80 mg/dL (ref 65–99)
OSMOLALITY: 282 (ref 275–301)
POTASSIUM: 2.7 mmol/L — AB (ref 3.5–5.1)
SGPT (ALT): 15 U/L
SODIUM: 141 mmol/L (ref 136–145)
Total Protein: 5.9 g/dL — ABNORMAL LOW (ref 6.4–8.2)

## 2014-04-05 LAB — MAGNESIUM: MAGNESIUM: 1.3 mg/dL — AB

## 2014-04-18 LAB — COMPREHENSIVE METABOLIC PANEL
ALBUMIN: 2.5 g/dL — AB (ref 3.4–5.0)
Alkaline Phosphatase: 204 U/L — ABNORMAL HIGH
Anion Gap: 11 (ref 7–16)
BILIRUBIN TOTAL: 0.9 mg/dL (ref 0.2–1.0)
BUN: 31 mg/dL — AB (ref 7–18)
CHLORIDE: 103 mmol/L (ref 98–107)
CO2: 28 mmol/L (ref 21–32)
Calcium, Total: 7.4 mg/dL — ABNORMAL LOW (ref 8.5–10.1)
Creatinine: 2.94 mg/dL — ABNORMAL HIGH (ref 0.60–1.30)
GFR CALC AF AMER: 28 — AB
GFR CALC NON AF AMER: 23 — AB
GLUCOSE: 124 mg/dL — AB (ref 65–99)
OSMOLALITY: 291 (ref 275–301)
POTASSIUM: 2.6 mmol/L — AB (ref 3.5–5.1)
SGOT(AST): 64 U/L — ABNORMAL HIGH (ref 15–37)
SGPT (ALT): 26 U/L
SODIUM: 142 mmol/L (ref 136–145)
Total Protein: 6.1 g/dL — ABNORMAL LOW (ref 6.4–8.2)

## 2014-04-18 LAB — CBC CANCER CENTER
Bands: 1 %
HCT: 32.5 % — AB (ref 40.0–52.0)
HGB: 10.3 g/dL — AB (ref 13.0–18.0)
Lymphocytes: 8 %
MCH: 27.8 pg (ref 26.0–34.0)
MCHC: 31.7 g/dL — AB (ref 32.0–36.0)
MCV: 88 fL (ref 80–100)
METAMYELOCYTE: 2 %
Monocytes: 9 %
NRBC/100 WBC: 3 /100
PLATELETS: 137 x10 3/mm — AB (ref 150–440)
RBC: 3.71 10*6/uL — AB (ref 4.40–5.90)
RDW: 16.1 % — ABNORMAL HIGH (ref 11.5–14.5)
Segmented Neutrophils: 80 %
WBC: 26.7 x10 3/mm — ABNORMAL HIGH (ref 3.8–10.6)

## 2014-04-18 LAB — MAGNESIUM: MAGNESIUM: 1.3 mg/dL — AB

## 2014-04-19 LAB — POTASSIUM: Potassium: 2.6 mmol/L — ABNORMAL LOW (ref 3.5–5.1)

## 2014-04-19 LAB — CREATININE, SERUM
Creatinine: 2.24 mg/dL — ABNORMAL HIGH (ref 0.60–1.30)
EGFR (African American): 38 — ABNORMAL LOW
EGFR (Non-African Amer.): 31 — ABNORMAL LOW

## 2014-04-19 LAB — MAGNESIUM: MAGNESIUM: 1 mg/dL — AB

## 2014-04-20 LAB — MAGNESIUM: MAGNESIUM: 3.7 mg/dL — AB

## 2014-04-20 LAB — POTASSIUM: Potassium: 2.8 mmol/L — ABNORMAL LOW (ref 3.5–5.1)

## 2014-04-20 LAB — CREATININE, SERUM
CREATININE: 1.94 mg/dL — AB (ref 0.60–1.30)
GFR CALC AF AMER: 45 — AB
GFR CALC NON AF AMER: 37 — AB

## 2014-04-27 ENCOUNTER — Ambulatory Visit: Payer: Self-pay | Admitting: Oncology

## 2014-05-03 ENCOUNTER — Ambulatory Visit: Payer: Self-pay | Admitting: Oncology

## 2014-05-03 LAB — CBC CANCER CENTER
BASOS ABS: 0.1 x10 3/mm (ref 0.0–0.1)
BASOS PCT: 1.2 %
EOS ABS: 0.1 x10 3/mm (ref 0.0–0.7)
Eosinophil %: 1.2 %
HCT: 30.6 % — AB (ref 40.0–52.0)
HGB: 10 g/dL — AB (ref 13.0–18.0)
LYMPHS PCT: 25.7 %
Lymphocyte #: 2 x10 3/mm (ref 1.0–3.6)
MCH: 29.5 pg (ref 26.0–34.0)
MCHC: 32.5 g/dL (ref 32.0–36.0)
MCV: 91 fL (ref 80–100)
MONOS PCT: 18.6 %
Monocyte #: 1.5 x10 3/mm — ABNORMAL HIGH (ref 0.2–1.0)
NEUTROS ABS: 4.2 x10 3/mm (ref 1.4–6.5)
Neutrophil %: 53.3 %
Platelet: 137 x10 3/mm — ABNORMAL LOW (ref 150–440)
RBC: 3.37 10*6/uL — AB (ref 4.40–5.90)
RDW: 20.3 % — ABNORMAL HIGH (ref 11.5–14.5)
WBC: 7.8 x10 3/mm (ref 3.8–10.6)

## 2014-05-03 LAB — COMPREHENSIVE METABOLIC PANEL
ALBUMIN: 2.1 g/dL — AB (ref 3.4–5.0)
ALT: 18 U/L
AST: 45 U/L — AB (ref 15–37)
Alkaline Phosphatase: 172 U/L — ABNORMAL HIGH
Anion Gap: 10 (ref 7–16)
BUN: 18 mg/dL (ref 7–18)
Bilirubin,Total: 1.6 mg/dL — ABNORMAL HIGH (ref 0.2–1.0)
CALCIUM: 8 mg/dL — AB (ref 8.5–10.1)
CO2: 24 mmol/L (ref 21–32)
Chloride: 111 mmol/L — ABNORMAL HIGH (ref 98–107)
Creatinine: 1.68 mg/dL — ABNORMAL HIGH (ref 0.60–1.30)
EGFR (African American): 53 — ABNORMAL LOW
EGFR (Non-African Amer.): 44 — ABNORMAL LOW
Glucose: 93 mg/dL (ref 65–99)
OSMOLALITY: 290 (ref 275–301)
Potassium: 2.6 mmol/L — ABNORMAL LOW (ref 3.5–5.1)
Sodium: 145 mmol/L (ref 136–145)
Total Protein: 5.8 g/dL — ABNORMAL LOW (ref 6.4–8.2)

## 2014-05-03 LAB — MAGNESIUM: MAGNESIUM: 1.4 mg/dL — AB

## 2014-05-10 LAB — CBC CANCER CENTER
BASOS ABS: 0 x10 3/mm (ref 0.0–0.1)
Basophil %: 0.8 %
Eosinophil #: 0.2 x10 3/mm (ref 0.0–0.7)
Eosinophil %: 2.9 %
HCT: 31.5 % — ABNORMAL LOW (ref 40.0–52.0)
HGB: 10.1 g/dL — ABNORMAL LOW (ref 13.0–18.0)
LYMPHS PCT: 28.4 %
Lymphocyte #: 1.6 x10 3/mm (ref 1.0–3.6)
MCH: 29.7 pg (ref 26.0–34.0)
MCHC: 32.2 g/dL (ref 32.0–36.0)
MCV: 92 fL (ref 80–100)
Monocyte #: 0.9 x10 3/mm (ref 0.2–1.0)
Monocyte %: 16.7 %
NEUTROS ABS: 2.9 x10 3/mm (ref 1.4–6.5)
Neutrophil %: 51.2 %
PLATELETS: 112 x10 3/mm — AB (ref 150–440)
RBC: 3.41 10*6/uL — AB (ref 4.40–5.90)
RDW: 20.5 % — ABNORMAL HIGH (ref 11.5–14.5)
WBC: 5.6 x10 3/mm (ref 3.8–10.6)

## 2014-05-10 LAB — MAGNESIUM: Magnesium: 1.5 mg/dL — ABNORMAL LOW

## 2014-05-10 LAB — POTASSIUM: POTASSIUM: 3.7 mmol/L (ref 3.5–5.1)

## 2014-05-17 LAB — CBC CANCER CENTER
BASOS ABS: 0 x10 3/mm (ref 0.0–0.1)
Basophil %: 0.8 %
EOS ABS: 0.1 x10 3/mm (ref 0.0–0.7)
EOS PCT: 2.1 %
HCT: 33 % — ABNORMAL LOW (ref 40.0–52.0)
HGB: 10.5 g/dL — ABNORMAL LOW (ref 13.0–18.0)
LYMPHS ABS: 1.4 x10 3/mm (ref 1.0–3.6)
LYMPHS PCT: 29.1 %
MCH: 29.9 pg (ref 26.0–34.0)
MCHC: 31.7 g/dL — AB (ref 32.0–36.0)
MCV: 94 fL (ref 80–100)
MONOS PCT: 12.7 %
Monocyte #: 0.6 x10 3/mm (ref 0.2–1.0)
Neutrophil #: 2.6 x10 3/mm (ref 1.4–6.5)
Neutrophil %: 55.3 %
Platelet: 104 x10 3/mm — ABNORMAL LOW (ref 150–440)
RBC: 3.5 10*6/uL — ABNORMAL LOW (ref 4.40–5.90)
RDW: 19.5 % — ABNORMAL HIGH (ref 11.5–14.5)
WBC: 4.7 x10 3/mm (ref 3.8–10.6)

## 2014-05-17 LAB — MAGNESIUM: Magnesium: 1.4 mg/dL — ABNORMAL LOW

## 2014-05-23 LAB — CBC CANCER CENTER
BASOS PCT: 4.3 %
Basophil #: 0.1 x10 3/mm (ref 0.0–0.1)
Eosinophil #: 0.1 x10 3/mm (ref 0.0–0.7)
Eosinophil %: 2.9 %
HCT: 33.8 % — AB (ref 40.0–52.0)
HGB: 10.7 g/dL — ABNORMAL LOW (ref 13.0–18.0)
LYMPHS ABS: 1 x10 3/mm (ref 1.0–3.6)
Lymphocyte %: 30.1 %
MCH: 30.2 pg (ref 26.0–34.0)
MCHC: 31.8 g/dL — ABNORMAL LOW (ref 32.0–36.0)
MCV: 95 fL (ref 80–100)
MONOS PCT: 13.2 %
Monocyte #: 0.4 x10 3/mm (ref 0.2–1.0)
NEUTROS PCT: 49.5 %
Neutrophil #: 1.7 x10 3/mm (ref 1.4–6.5)
PLATELETS: 89 x10 3/mm — AB (ref 150–440)
RBC: 3.55 10*6/uL — ABNORMAL LOW (ref 4.40–5.90)
RDW: 17.6 % — AB (ref 11.5–14.5)
WBC: 3.4 x10 3/mm — ABNORMAL LOW (ref 3.8–10.6)

## 2014-05-23 LAB — COMPREHENSIVE METABOLIC PANEL
ALBUMIN: 2.2 g/dL — AB (ref 3.4–5.0)
AST: 49 U/L — AB (ref 15–37)
Alkaline Phosphatase: 147 U/L — ABNORMAL HIGH
Anion Gap: 9 (ref 7–16)
BUN: 12 mg/dL (ref 7–18)
Bilirubin,Total: 1.3 mg/dL — ABNORMAL HIGH (ref 0.2–1.0)
CHLORIDE: 109 mmol/L — AB (ref 98–107)
CO2: 25 mmol/L (ref 21–32)
CREATININE: 1.42 mg/dL — AB (ref 0.60–1.30)
Calcium, Total: 7.7 mg/dL — ABNORMAL LOW (ref 8.5–10.1)
EGFR (Non-African Amer.): 53 — ABNORMAL LOW
Glucose: 83 mg/dL (ref 65–99)
Osmolality: 284 (ref 275–301)
Potassium: 3.9 mmol/L (ref 3.5–5.1)
SGPT (ALT): 19 U/L
SODIUM: 143 mmol/L (ref 136–145)
Total Protein: 5.8 g/dL — ABNORMAL LOW (ref 6.4–8.2)

## 2014-05-23 LAB — PROTIME-INR
INR: 1.1
Prothrombin Time: 14.3 secs (ref 11.5–14.7)

## 2014-05-23 LAB — MAGNESIUM: MAGNESIUM: 1.4 mg/dL — AB

## 2014-05-23 LAB — APTT: Activated PTT: 34.9 secs (ref 23.6–35.9)

## 2014-05-31 LAB — MAGNESIUM: Magnesium: 1.2 mg/dL — ABNORMAL LOW

## 2014-06-03 ENCOUNTER — Ambulatory Visit: Payer: Self-pay | Admitting: Oncology

## 2014-06-08 LAB — CBC CANCER CENTER
BASOS PCT: 1.2 %
Basophil #: 0 x10 3/mm (ref 0.0–0.1)
EOS ABS: 0.1 x10 3/mm (ref 0.0–0.7)
EOS PCT: 3.5 %
HCT: 36.8 % — AB (ref 40.0–52.0)
HGB: 11.9 g/dL — AB (ref 13.0–18.0)
LYMPHS PCT: 22.4 %
Lymphocyte #: 0.6 x10 3/mm — ABNORMAL LOW (ref 1.0–3.6)
MCH: 30.3 pg (ref 26.0–34.0)
MCHC: 32.2 g/dL (ref 32.0–36.0)
MCV: 94 fL (ref 80–100)
Monocyte #: 0.3 x10 3/mm (ref 0.2–1.0)
Monocyte %: 12.3 %
NEUTROS ABS: 1.7 x10 3/mm (ref 1.4–6.5)
Neutrophil %: 60.6 %
Platelet: 99 x10 3/mm — ABNORMAL LOW (ref 150–440)
RBC: 3.92 10*6/uL — ABNORMAL LOW (ref 4.40–5.90)
RDW: 15.6 % — ABNORMAL HIGH (ref 11.5–14.5)
WBC: 2.7 x10 3/mm — ABNORMAL LOW (ref 3.8–10.6)

## 2014-06-08 LAB — COMPREHENSIVE METABOLIC PANEL
ALK PHOS: 119 U/L — AB
ANION GAP: 8 (ref 7–16)
Albumin: 2.3 g/dL — ABNORMAL LOW (ref 3.4–5.0)
BILIRUBIN TOTAL: 0.8 mg/dL (ref 0.2–1.0)
BUN: 12 mg/dL (ref 7–18)
CALCIUM: 7.7 mg/dL — AB (ref 8.5–10.1)
CO2: 25 mmol/L (ref 21–32)
CREATININE: 1.31 mg/dL — AB (ref 0.60–1.30)
Chloride: 109 mmol/L — ABNORMAL HIGH (ref 98–107)
EGFR (African American): >60
GFR CALC NON AF AMER: 58 — AB
Glucose: 90 mg/dL (ref 65–99)
OSMOLALITY: 282 (ref 275–301)
POTASSIUM: 3.5 mmol/L (ref 3.5–5.1)
SGOT(AST): 42 U/L — ABNORMAL HIGH (ref 15–37)
SGPT (ALT): 20 U/L
SODIUM: 142 mmol/L (ref 136–145)
Total Protein: 6.1 g/dL — ABNORMAL LOW (ref 6.4–8.2)

## 2014-06-08 LAB — MAGNESIUM: MAGNESIUM: 1.3 mg/dL — AB

## 2014-06-15 LAB — CBC CANCER CENTER
BASOS ABS: 0 x10 3/mm (ref 0.0–0.1)
BASOS PCT: 0.3 %
EOS ABS: 0 x10 3/mm (ref 0.0–0.7)
Eosinophil %: 0.1 %
HCT: 35.4 % — AB (ref 40.0–52.0)
HGB: 11.6 g/dL — AB (ref 13.0–18.0)
Lymphocyte #: 0.4 x10 3/mm — ABNORMAL LOW (ref 1.0–3.6)
Lymphocyte %: 11.9 %
MCH: 30.4 pg (ref 26.0–34.0)
MCHC: 32.7 g/dL (ref 32.0–36.0)
MCV: 93 fL (ref 80–100)
MONO ABS: 0.3 x10 3/mm (ref 0.2–1.0)
Monocyte %: 9.7 %
NEUTROS PCT: 78 %
Neutrophil #: 2.7 x10 3/mm (ref 1.4–6.5)
PLATELETS: 91 x10 3/mm — AB (ref 150–440)
RBC: 3.81 10*6/uL — AB (ref 4.40–5.90)
RDW: 15.2 % — AB (ref 11.5–14.5)
WBC: 3.4 x10 3/mm — ABNORMAL LOW (ref 3.8–10.6)

## 2014-06-15 LAB — MAGNESIUM: Magnesium: 1.1 mg/dL — ABNORMAL LOW

## 2014-07-04 ENCOUNTER — Ambulatory Visit: Payer: Self-pay | Admitting: Oncology

## 2014-07-07 LAB — CBC CANCER CENTER
BASOS PCT: 0.3 %
Basophil #: 0 x10 3/mm (ref 0.0–0.1)
EOS PCT: 0.1 %
Eosinophil #: 0 x10 3/mm (ref 0.0–0.7)
HCT: 41.6 % (ref 40.0–52.0)
HGB: 13.6 g/dL (ref 13.0–18.0)
LYMPHS ABS: 0.1 x10 3/mm — AB (ref 1.0–3.6)
Lymphocyte %: 2.5 %
MCH: 30.6 pg (ref 26.0–34.0)
MCHC: 32.8 g/dL (ref 32.0–36.0)
MCV: 93 fL (ref 80–100)
MONO ABS: 0.4 x10 3/mm (ref 0.2–1.0)
Monocyte %: 7.3 %
Neutrophil #: 4.8 x10 3/mm (ref 1.4–6.5)
Neutrophil %: 89.8 %
Platelet: 50 x10 3/mm — ABNORMAL LOW (ref 150–440)
RBC: 4.46 10*6/uL (ref 4.40–5.90)
RDW: 16.2 % — ABNORMAL HIGH (ref 11.5–14.5)
WBC: 5.4 x10 3/mm (ref 3.8–10.6)

## 2014-07-11 LAB — CBC CANCER CENTER
Basophil #: 0 x10 3/mm (ref 0.0–0.1)
Basophil %: 0.4 %
EOS ABS: 0 x10 3/mm (ref 0.0–0.7)
Eosinophil %: 0 %
HCT: 45.8 % (ref 40.0–52.0)
HGB: 14.8 g/dL (ref 13.0–18.0)
LYMPHS PCT: 4 %
Lymphocyte #: 0.1 x10 3/mm — ABNORMAL LOW (ref 1.0–3.6)
MCH: 30.4 pg (ref 26.0–34.0)
MCHC: 32.4 g/dL (ref 32.0–36.0)
MCV: 94 fL (ref 80–100)
Monocyte #: 0.2 x10 3/mm (ref 0.2–1.0)
Monocyte %: 6.6 %
NEUTROS PCT: 89 %
Neutrophil #: 3.2 x10 3/mm (ref 1.4–6.5)
PLATELETS: 42 x10 3/mm — AB (ref 150–440)
RBC: 4.87 10*6/uL (ref 4.40–5.90)
RDW: 16.2 % — ABNORMAL HIGH (ref 11.5–14.5)
WBC: 3.6 x10 3/mm — ABNORMAL LOW (ref 3.8–10.6)

## 2014-07-26 ENCOUNTER — Other Ambulatory Visit: Payer: Self-pay | Admitting: Physician Assistant

## 2014-07-26 ENCOUNTER — Inpatient Hospital Stay: Payer: Self-pay | Admitting: Internal Medicine

## 2014-07-26 DIAGNOSIS — I350 Nonrheumatic aortic (valve) stenosis: Secondary | ICD-10-CM

## 2014-07-26 DIAGNOSIS — D696 Thrombocytopenia, unspecified: Secondary | ICD-10-CM

## 2014-07-26 DIAGNOSIS — R7989 Other specified abnormal findings of blood chemistry: Secondary | ICD-10-CM

## 2014-08-02 ENCOUNTER — Ambulatory Visit
Admit: 2014-08-02 | Disposition: A | Discharge status: Home / Self Care | Payer: Self-pay | Attending: Oncology | Admitting: Oncology

## 2014-09-02 ENCOUNTER — Ambulatory Visit
Admit: 2014-09-02 | Disposition: A | Discharge status: Home / Self Care | Payer: Self-pay | Attending: Oncology | Admitting: Oncology

## 2014-09-02 DEATH — deceased

## 2014-09-24 NOTE — Op Note (Signed)
PATIENT NAME:  [name], Anthony Goodwin DATE OF BIRTH:  1946/05/21  DATE OF PROCEDURE:  02/09/2014  PREOPERATIVE DIAGNOSIS:  Squamous cell cancer of the tongue.   POSTOPERATIVE DIAGNOSIS:  Squamous cell cancer of the tongue.   PROCEDURES:  1.  Ultrasound guidance for vascular access, right internal jugular vein.  2.  Fluoroscopic guidance for placement of catheter.  3.  Placement of CT compatible Port-A-Cath, right internal jugular vein.   SURGEON:  Anthony Goodwin [place], MD.   ANESTHESIA:  Local with moderate conscious sedation.   FLUOROSCOPY TIME:  Less than 1 minute.   CONTRAST:  Zero.   ESTIMATED BLOOD LOSS:  Minimal.  INDICATION FOR PROCEDURE: This is a 69 year old gentleman with tongue cancer. He needs a Port-A-Cath for chemotherapy and durable venous access. The risks and benefits were discussed. Informed consent was obtained.   DESCRIPTION OF THE PROCEDURE: The patient was brought to the vascular and interventional radiology suite. The right neck and chest were sterilely prepped and draped, and a sterile surgical field was created. Ultrasound was used to help visualize a patent right internal jugular vein. This was then accessed under direct ultrasound guidance without difficulty with a Seldinger needle and a permanent image was recorded. A J-wire was placed. After skin nick and dilatation, the peel-away sheath was then placed over the wire. I then anesthetized an area under the clavicle approximately 2 fingerbreadths. A transverse incision was created and an inferior pocket was created with electrocautery and blunt dissection. The port was then brought onto the field, placed into the pocket and secured to the chest wall with 2 Prolene sutures. The catheter was connected to the port and tunneled from the subclavicular incision to the access site. Fluoroscopic guidance was used to cut the catheter to an appropriate length. The catheter was then placed through the peel-away sheath and  the peel-away sheath was removed. The catheter tip was parked in excellent location in the mid superior vena cava.  The pocket was then irrigated with antibiotic-impregnated saline and the wound was closed with a running 3-0 Vicryl and a 4-0 Monocryl. The access incision was closed with a single 4-0 Monocryl. The Huber needle was used to withdraw blood and flush the port with heparinized saline. Dermabond was then placed as a dressing. The patient tolerated the procedure well and was taken to the recovery room in stable condition.     ____________________________ Anthony NeedyJason S. Skyler Dusing, MD jsd:TT D: 02/09/2014 14:52:34 ET T: 02/09/2014 17:05:14 ET JOB#: [id number]  cc: Anthony NeedyJason S. [name], MD, <Dictator> Anthony NeedyJASON S Iyad Deroo MD ELECTRONICALLY SIGNED 02/14/2014 15:06

## 2014-09-24 NOTE — Consult Note (Signed)
Reason for Visit: This 69 year old Male Anthony Goodwin presents to the clinic for initial evaluation of  head and neck cancer .   Referred by Dr. Doylene Canninghoksi.  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 He's gone on to develop some dysphasia. Was seen by ENT were a large right floor of mouth mass encroaching on the base of tongue was seen. Biopsy was positive withsquamous cell carcinoma. PET CT scan was performed showing enhancing mass involving the right tongue base and hypermetabolic level IIA lymph nodes consistent with known diagnosis. He's been seen by medical oncology port has been ordered and plan is to start induction chemotherapy. He is referred today to radiation oncology for opinion. Doing fairly well continues to have problems with dysphasia and had a neck pain. He is only on nonsteroidals.  Past, Family and Social History:  Past Medical History positive   Family History noncontributory   Social History positive   Social History Comments greater than 100-pack-year smoking history also EtOH abuse history in the past   Additional Past Medical and Surgical History accompanied by family member today and care worker   Allergies:   Penicillin: GI Distress  Home Meds:  Home Medications: Medication Instructions Status   acetaminophen-HYDROcodone 325 mg-5 mg oral tablet 1 tab(s) orally every 4 to 6 hours, As Needed - for Pain Active  lisinopril 40 mg oral tablet 1 tab(s) orally once a day Active  hydrochlorothiazide 12.5 mg oral tablet 1 tab(s) orally once a day Active  aspirin 81 mg oral tablet 1 tab(s) orally once a day Active  atenolol 50 mg oral tablet 1 tab(s) orally once a day Active  ibuprofen 600 mg oral tablet 1 tab(s) orally 3 times a day, As Needed - for Nicotine Craving, for Pain  Active  loratadine 10 mg oral tablet 1 tab(s) orally once a day, As Needed Active   Review of Systems:  General negative   Performance Status (ECOG) 0   Skin negative   Breast negative   Ophthalmologic negative   ENMT see HPI   Respiratory and Thorax negative   Cardiovascular negative   Gastrointestinal negative   Genitourinary negative   Musculoskeletal negative   Neurological negative   Psychiatric negative   Hematology/Lymphatics negative   Endocrine negative   Allergic/Immunologic negative   Review of Systems   except for head and neck pain and dysphasiadenies any weight loss, fatigue, weakness, fever, chills or night sweats. Anthony Goodwin denies any loss of vision, blurred vision. Anthony Goodwin denies any ringing  of the ears or hearing loss. No irregular heartbeat. Anthony Goodwin denies heart murmur or history of fainting. Anthony Goodwin denies any chest pain or pain radiating to her upper extremities. Anthony Goodwin denies any shortness of breath, difficulty breathing at night, cough or hemoptysis. Anthony Goodwin denies any swelling in the lower legs. Anthony Goodwin denies any nausea vomiting, vomiting of blood, or coffee ground material in  the vomitus. Anthony Goodwin denies any stomach pain. Anthony Goodwin states has had normal bowel movements no significant constipation or diarrhea. Anthony Goodwin denies any dysuria, hematuria or significant nocturia. Anthony Goodwin denies any problems walking, swelling in the joints or loss of balance. Anthony Goodwin denies any skin  changes, loss of hair or loss of weight. Anthony Goodwin denies any excessive worrying or anxiety or significant depression. Anthony Goodwin denies any problems with insomnia. Anthony Goodwin denies excessive thirst, polyuria, polydipsia. Anthony Goodwin denies any swollen glands, Anthony Goodwin denies easy bruising or easy bleeding. Anthony Goodwin denies any recent infections, allergies or URI. Anthony Goodwin "s visual fields have not changed significantly in recent time.   Nursing Notes:  Nursing Vital Signs and Chemo Nursing Nursing Notes: *CC Vital Signs Flowsheet:   03-Sep-15 08:43  Temp Temperature 95  Pulse Pulse 71  Respirations Respirations 20  SBP SBP 125  DBP DBP 79  Pain Scale (0-10)  0  Current Weight (kg) (kg) 72.7  Height (cm) centimeters 191  BSA (m2) 2   Physical Exam:  General/Skin/HEENT:  General normal   Skin normal   Eyes normal   Additional PE well-developed male in NAD. Anthony Goodwin is edentulous wears upper and lower bridges. There is a large mass present in the right floor of the mouth extending to the base of tongue. Indirect mirror examination shows upper a clear vallecula normal limits. Anthony Goodwin has adenopathy in the high right cervical chain left cervical chain clear supraclavicular fossa is clear. Lungs are clear to A&P cardiac examination shows regular rate and rhythm.   Breasts/Resp/CV/GI/GU:  Respiratory and Thorax normal   Cardiovascular normal   Gastrointestinal normal   Genitourinary normal   MS/Neuro/Psych/Lymph:  Musculoskeletal normal   Neurological normal   Lymphatics normal   Other Results:  Radiology Results: LabUnknown:    26-Aug-15 12:52, PET/CT Scan Head/Neck CA Diagnosis  PACS Image   Nuclear Med:  PET/CT Scan Head/Neck CA Diagnosis   REASON FOR EXAM:    Positive Squamous Cell Carcinoma in Right Neck  COMMENTS:       PROCEDURE: PET - PET/CT DX HEAD/NECK CA  - Jan 26 2014 12:52PM     CLINICAL DATA:  Initial treatment strategy for Head and neck Cancer.  Right neck squamous  cell carcinoma.    EXAM:  NUCLEAR MEDICINE PET SKULL BASE TO THIGH    TECHNIQUE:  12.84 mCi F-18 FDG was injected intravenously. Full-ring PET imaging  was performed from the skull base to thigh after the radiotracer. CT  data was obtained and used for attenuation correction and anatomic  localization.    FASTING BLOOD GLUCOSE:  Value: 81 mg/dl    COMPARISON:  Neck CT 04/2014    FINDINGS:  NECK    The previously demonstrated irregular enhancing mass involving the  right tongue base is hypermetabolic with an SUV max of 17.4. No  other focal lesions involving the pharyngeal mucosal space are  demonstrated. There are 2 level IIa hypermetabolic right cervical  lymph nodes. The larger more inferior one has an SUV max of 13.8.  There is no hypermetabolic nodal activity inferior to the hyoid bone  or within the left neck.    CHEST    There are no hypermetabolic mediastinal, hilar or axillary lymph  nodes. There is no abnormal pulmonary metabolic activity. There is  mild chronic lung disease with linear scarring in both lung bases.  Coronary artery calcifications are noted.    ABDOMEN/PELVIS    There is no hypermetabolic activity within the liver, adrenal  glands, spleen or  pancreas. There is no hypermetabolic nodal  activity. There is prominent metabolic activity associated with the  distal gastric body and antrum. The bowel activity is otherwise  normal. There is no hypermetabolic nodal activity. There are  probable multiple small gallstones. There is a probable small cyst  in the lower pole of the left kidney, devoid of metabolic activity.    SKELETON    There is no hypermetabolic activity to suggest osseous metastatic  disease.     IMPRESSION:  1. The previously demonstrated enhancing mass involving the right  tongue base and the right level IIa cervical lymph nodes are  hypermetabolic consistent with metastatic squamous cell carcinoma.  2. No distant metastases  identified.  3. Prominent diffuse activity within the distal stomach, likely  inflammatory. Correlate clinically.  4. Cholelithiasis and left renal cyst.      Electronically Signed    By: Roxy Horseman M.D.    On: 01/26/2014 14:38         Verified By: Gerrianne Scale, M.D.,   Relevent Results:   Relevant Scans and Labs PET CT scan reviewed   Assessment and Plan: Impression:   stage IV a squamous cell carcinoma of the base of tongue right side in 69 year old male for planned induction chemotherapy followed by concurrent chemoradiation with curative intent Plan:   at this time I discussed the case personally with medical oncology. Anthony Goodwin scheduled for port placement followed by at least 2 cycles of induction chemotherapy. I will plan on delivering 7000 cGy to his primary tumor including hypermetabolic lymph nodes by PET CT criteria using IM RT dose painting technique. Would treat the remainder of his head and neck lymph nodes to 5400 cGy. Would use IM RT treatment planning the liver despair his spinal cord, oral cavity, major salivary glands. Risks and benefits of treatment including possible xerostomia, alteration of taste, skin reaction, oral mucositis, alteration of blood counts, all were described in detail to the Anthony Goodwin and his caregivers. I will see him again in followup after completion of induction chemotherapy to arrange CT simulation. I also asked medical oncology to increase his pain medication to a narcotic analgesic. I will also set him up for followup after his port placement with medical oncology.  I would like to take this opportunity for allowing me to participate in the care of your Anthony Goodwin..  Fax to Physician:  Physicians To Recieve Fax: Davina Poke, MD - [phone].  Electronic Signatures: Rebeca Alert (MD)  (Signed 03-Sep-15 10:04)  Authored: HPI, Diagnosis, PFSH, Allergies, Home Meds, ROS, Nursing Notes, Physical Exam, Other Results, Relevent  Results, Encounter Assessment and Plan, Fax to Physician   Last Updated: 03-Sep-15 10:04 by Rebeca Alert (MD)

## 2014-10-02 NOTE — Consult Note (Signed)
Chief Complaint:  Subjective/Chief Complaint Patient in need of PEG tube due to poor PO intake.   VITAL SIGNS/ANCILLARY NOTES: **Vital Signs.:   27-Feb-16 00:06  Vital Signs Type Q 4hr  Respirations Respirations 18    04:20  Vital Signs Type Routine  Temperature Temperature (F) 97.6  Celsius 36.4  Temperature Source oral  Pulse Pulse 97  Respirations Respirations 18  Systolic BP Systolic BP 119  Diastolic BP (mmHg) Diastolic BP (mmHg) 70  Mean BP 86  Pulse Ox % Pulse Ox % 90  Pulse Ox Activity Level  At rest  Oxygen Delivery Room Air/ 21 %   Brief Assessment:  GEN well developed   Respiratory normal resp effort   Lab Results: Routine Chem:  27-Feb-16 04:49   Result Comment PLATELET COUNT - PLATELET CLUMPING IN SPECIMEN. INSTRUMENT  - COUNT CANNOT BE REPORTED. SMEAR REVIEW  - INDICATES THE PLATELET COUNT APPEARS   - LOW (LESS THAN 140,000).  Result(s) reported on 30 Jul 2014 at 07:12AM.  Routine Hem:  27-Feb-16 04:49   WBC (CBC)  3.1  RBC (CBC) 4.55  Hemoglobin (CBC) 13.8  Hematocrit (CBC) 42.9  Platelet Count (CBC) See comment  MCV 94  MCH 30.2  MCHC 32.1  RDW  18.5  Neutrophil % 90.4  Lymphocyte % 3.5  Monocyte % 6.0  Eosinophil % 0.0  Basophil % 0.1  Neutrophil # 2.8  Lymphocyte #  0.1  Monocyte # 0.2  Eosinophil # 0.0  Basophil # 0.0   Assessment/Plan:  Assessment/Plan:  Assessment Poor PO intake. HCV ab positive.   Plan The patient's platlets are too low to place a PEG.  Consider NG tube. Will follow and consider placement once Plt's better. HCV treatment should be considered if patients long term prognosis improves.   Electronic Signatures: Midge MiniumWohl, Chena Chohan (MD)  (Signed [phone]-Feb-16 07:56)  Authored: Chief Complaint, VITAL SIGNS/ANCILLARY NOTES, Brief Assessment, Lab Results, Assessment/Plan   Last Updated: 27-Feb-16 07:56 by Midge MiniumWohl, Antoinetta Berrones (MD)

## 2014-10-02 NOTE — Consult Note (Signed)
Brief Consult Note: Diagnosis: PEG placement.   Patient was seen by consultant.   Comments: Mr. Anthony Goodwin is a very pleasant, unfortunate 69 y/o male with stage IV carcinoma base of tongue last chemo/radiation 2/12.  He has significant weight loss &  anorexia & we were consulted to consider PEG placement for nourishment.  Discussed risks/benefits of procedure which include but are not limited to bleeding, infection, perforation & drug reaction.  Discussed potential complications with PEG including failure, leaking, infection at site skin or internally, Patient agrees with this plan & consent will be obtained.  Likely will be early next week with Dr Servando SnareWohl.  Hopefully platelet count of 28 will have improved by then as well.  Thanks for allowing us to participate in his care.  Please see full dictated note. #[id number].  Electronic Signatures: Joselyn ArrowJones, Mehreen Azizi L (NP)  (Signed [phone]-Feb-16 12:31)  Authored: Brief Consult Note   Last Updated: 26-Feb-16 12:31 by Joselyn ArrowJones, Jamina Macbeth L (NP)
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
## 2014-10-02 NOTE — Consult Note (Signed)
   Comments   Pt less responsive today. Still not eating. I spoke with pt's sister, Aniceto Bosslma, who is pt's Education officer, environmentaldecision-maker. Updated her on pt's current condition and decline. Discussed the option of transfer to the [facility] and sister is in agreement. Also discussed code status with sister and pt is DNR. Order entered. Out-of-facility DNR completed and placed in chart. [facility]ospice Home orders entered.  it pt's sister, Aniceto Bosslma. [phone]  Electronic Signatures: Haliyah Fryman, Harriett SineNancy (MD)  (Signed 02-Mar-16 12:08)  Authored: Palliative Care   Last Updated: 02-Mar-16 12:08 by Ketrick Matney, Harriett SineNancy (MD)
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
## 2014-10-02 NOTE — Consult Note (Signed)
Chief Complaint:  Subjective/Chief Complaint Patient with poor PO intake. Asked to see patient for PEG. Platlets have been low.   VITAL SIGNS/ANCILLARY NOTES: **Vital Signs.:   28-Feb-16 07:53  Vital Signs Type Q 4hr  Temperature Temperature (F) 97.3  Celsius 36.2  Temperature Source oral  Pulse Pulse 72  Respirations Respirations 18  Systolic BP Systolic BP 120  Diastolic BP (mmHg) Diastolic BP (mmHg) 85  Mean BP 96  Pulse Ox % Pulse Ox % 90  Pulse Ox Activity Level  At rest  Oxygen Delivery Room Air/ 21 %   Brief Assessment:  GEN no acute distress   Respiratory normal resp effort  no use of accessory muscles   Lab Results: Routine Chem:  28-Feb-16 06:02   Result Comment LABS - This specimen was collected through an   - indwelling catheter or arterial line.  - A minimum of 5mls of blood was wasted prior    - to collecting the sample.  Interpret  - results with caution. PLATELETS - RESULTS VERIFIED BY REPEAT TESTING.  - VERIFIED BY SMEAR ESTIMATE  - CRITICAL VALUE PREVIOUSLY NOTIFIED.  Result(s) reported on 31 Jul 2014 at 06:28AM.  Routine Hem:  28-Feb-16 06:02   WBC (CBC)  3.2  RBC (CBC)  4.06  Hemoglobin (CBC)  12.4  Hematocrit (CBC)  38.1  Platelet Count (CBC)  18  MCV 94  MCH 30.5  MCHC 32.5  RDW  18.2  Neutrophil % 93.1  Lymphocyte % 3.6  Monocyte % 2.4  Eosinophil % 0.2  Basophil % 0.7  Neutrophil # 3.0  Lymphocyte #  0.1  Monocyte #  0.1  Eosinophil # 0.0  Basophil # 0.0   Assessment/Plan:  Assessment/Plan:  Assessment Poor PO intake.   Plan Platlets of 18 today. Will not attempt PEG until platlets over 100.  Consider other methods of feeding. Will sign off. Please call when platlets high enough for PEG.   Electronic Signatures: Midge MiniumWohl, Mathias Bogacki (MD)  (Signed [phone]-Feb-16 08:18)  Authored: Chief Complaint, VITAL SIGNS/ANCILLARY NOTES, Brief Assessment, Lab Results, Assessment/Plan   Last Updated: 28-Feb-16 08:18 by Midge MiniumWohl, Adonus Uselman (MD)

## 2014-10-02 NOTE — Consult Note (Signed)
General Aspect Primary Cardiologist: New to [facility] _____________  69 year old male with history of IV throat and tongue cancer currently undergoing chemo and radiation and HTN who has been being treated for weakness and dehydration at the [place] the past 2 days presented to [facility] with right upper lobe PNA, thrombocytopenia of 14, and troponin of 1.70 without chest pain.  _____________  PMH: 1. 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 HTN ____________   Present Illness 69 year old male with the above problem list who presented to [facility] today with right upper lobe PNA, thrombocytopenia of 14, and troponin of 1.70 without chest pain. He has no known prior cardiac history and has never seen a cardiologist previously. No prior stress tests or cardiac caths. For the past 3 days he has been unable to eat or drink 2/2 severe tongue and throat pain. He received IV fluids at the [facility] on 2/22 along with empiric esophageal Candidia treatment. Prior to the past 3 days his family reports he was fairly active at home. He was not limited by chest pain or SOB, however 3 days ago he became so weak that all he could do was lay in the bed. He does not currently complain of SOB. He is able to lay completely supine with one pillow wihout any issues. No lower extremity swelling. He notes progressive weight loss of 44 pounds. He does note a chronic cough that is not productive. He has smoked approximately 1/2 ppd since he was 69 years old. No alcohol. No illicit drugs.   In the ER he was noted to have a CXR that showed right upper lobe PNA, PLT count of 14,000. Troponin of 1.70. SCr 1.73.  Albumin was found to be at 1.6. UA showed possible UTI. He was started on Levaquin in the ED. He denies ever having chest pain or SOB. He is currently weak. Blood cultures have been drawn. He has consented for transfusion platelets at this time given his thrombocytopenia.   Physical  Exam:  GEN well developed, thin, critically ill appearing   HEENT hearing intact to voice, dry oral mucosa   NECK supple  no JVD   RESP normal resp effort  rhonchi  RUL   CARD Regular rate and rhythm  No murmur   ABD denies tenderness  soft   LYMPH negative neck   EXTR trace non-pitting edema to the mid shin bilaterally   SKIN normal to palpation   NEURO motor/sensory function intact   PSYCH alert   Review of Systems:  Subjective/Chief Complaint weakness   General: Fatigue  Weakness   Skin: No Complaints   ENT: Sore Throat  Dysphagia   Eyes: No Complaints   Neck: No Complaints   Respiratory: Frequent cough   Cardiovascular: Edema   Gastrointestinal: No Complaints   Genitourinary: No Complaints   Vascular: Leg cramping   Musculoskeletal: Muscle or Joint Stiffness   Neurologic: No Complaints   Hematologic: No Complaints   Endocrine: No Complaints   Psychiatric: No Complaints   Review of Systems: All other systems were reviewed and found to be negative   Medications/Allergies Reviewed Medications/Allergies reviewed   Family & Social History:  Family and Social History:  Family History Negative  Hypertension  Diabetes Mellitus  COPD   Social History positive  tobacco, negative ETOH, negative Illicit drugs   + Tobacco Current (within 1 year)  1/2 ppd since age 81   Place of Living  Home     seasonal allergies:    HTN:    fatigue:    CA in rt  tongue base:          Admit Diagnosis:   PNEUMONIA: Onset Date: 26-Jul-2014, Status: Active, Description: PNEUMONIA  Home Medications: Medication Instructions Status  dexamethasone 4 mg oral tablet 1 tab(s) orally once a day for 7 days, then 1 tab(s) every other day for 14 days Active  nystatin 100000 units/mL oral suspension 5 milliliter(s) orally 4 times a day until finished Active  Diflucan 100 mg oral tablet 1 tab(s) orally once a day for 7 days Active  atenolol 50 mg oral tablet 1 tab(s)  orally once a day Active  loratadine 10 mg oral tablet 1 tab(s) orally once a day, As Needed for allergy symptoms Active  potassium chloride 20 mEq/15 mL oral liquid 15 milliliter(s) orally once a day Active  oxyCODONE 5 mg/5 mL oral solution 5 milliliter(s) orally every 4 to 6 hours, As Needed - for Pain Active  silver sulfADIAZINE topical 1% topical cream Apply topically to affected area 3 times a day Active  acetaminophen extended release 650 mg oral tablet, extended release 1 tab(s) orally 2 times a day, As Needed - for Pain Active  promethazine 25 mg oral tablet 1 tab(s) orally 4 times a day, As Needed - for Nausea, Vomiting Active  lisinopril 40 mg oral tablet 1 tab(s) orally once a day Active  Aspirin Enteric Coated 81 mg oral delayed release tablet 1 tab(s) orally once a day Active   Lab Results:  Hepatic:  23-Feb-16 10:17   Bilirubin, Total  3.0  Bilirubin, Direct  1.9 (Result(s) reported on 26 Jul 2014 at 11:49AM.)  Alkaline Phosphatase  213  SGPT (ALT) 55  SGOT (AST)  56  Total Protein, Serum  4.7  Albumin, Serum  1.6  Routine BB:  23-Feb-16 10:17   ABO Group + Rh Type B Positive  Result(s) reported on 26 Jul 2014 at 01:29PM.  Platelets (Blood Component) Ready (Result(s) reported on 26 Jul 2014 at 02:36PM.)  Routine Chem:  23-Feb-16 10:17   Result Comment PLATELETS - RESULTS VERIFIED BY REPEAT TESTING.  - VERIFIED BY SMEAR ESTIMATE  - NOTIFIED OF CRITICAL VALUE  - SHANNON MARTIN ON 07/26/14 @ 1204...qsd  - READ-BACK PROCESS PERFORMED.  Result(s) reported on 26 Jul 2014 at 10:50AM.  Glucose, Serum 71  BUN  36  Creatinine (comp)  1.73  Sodium, Serum  148  Potassium, Serum 4.3  Chloride, Serum  117  CO2, Serum 23  Calcium (Total), Serum  7.9  Anion Gap 8  Osmolality (calc) 301  eGFR (African American)  51  eGFR (Non-African American)  42 (eGFR values <53m/min/1.73 m2 may be an indication of chronic kidney disease (CKD). Calculated eGFR, using the MRDR Study  equation, is useful in  patients with stable renal function. The eGFR calculation will not be reliable in acutely ill patients when serum creatinine is changing rapidly. It is not useful in patients on dialysis. The eGFR calculation may not be applicable to patients at the low and high extremes of body sizes, pregnant women, and vegetarians.)  Cardiac:  23-Feb-16 10:17   Troponin I  1.70 (0.00-0.05 0.05 ng/mL or less: NEGATIVE  Repeat testing in 3-6 hrs  if clinically indicated. >0.05 ng/mL: POTENTIAL  MYOCARDIAL INJURY. Repeat  testing in 3-6 hrs if  clinically indicated. NOTE: An increase or decrease  of 30% or more on serial  testing suggests a  clinically important change)  CK, Total 70 (Result(s) reported on 26 Jul 2014 at 12:40PM.)  CPK-MB, Serum  5.7 (Result(s) reported on 26 Jul 2014 at 11:55AM.)  Routine UA:  23-Feb-16 11:32   Color (UA) Amber  Clarity (UA) Clear  Glucose (UA) Negative  Bilirubin (UA) Negative  Ketones (UA) Negative  Specific Gravity (UA) 1.015  Blood (UA) 2+  pH (UA) 5.0  Protein (UA) Negative  Nitrite (UA) Negative  Leukocyte Esterase (UA) 1+ (Result(s) reported on 26 Jul 2014 at 12:13PM.)  RBC (UA) 4 /HPF  WBC (UA) 11 /HPF  Bacteria (UA) TRACE  Epithelial Cells (UA) <1 /HPF (Result(s) reported on 26 Jul 2014 at 12:13PM.)  Routine Coag:  23-Feb-16 10:17   Prothrombin  16.6 (11.4-15.0 NOTE: New Reference Range  07/01/14)  INR 1.3 (INR reference interval applies to patients on anticoagulant therapy. A single INR therapeutic range for coumarins is not optimal for all indications; however, the suggested range for most indications is 2.0 - 3.0. Exceptions to the INR Reference Range may include: Prosthetic heart valves, acute myocardial infarction, prevention of myocardial infarction, and combinations of aspirin and anticoagulant. The need for a higher or lower target INR must be assessed individually. Reference: The Pharmacology and  Management of the Vitamin K  antagonists: the seventh ACCP Conference on Antithrombotic and Thrombolytic Therapy. FBPZW.2585 Sept:126 (3suppl): [id number]. A HCT value >55% may artifactually increase the PT.  In one study,  the increase was an average of 25%. Reference:  "Effect on Routine and Special Coagulation Testing Values of Citrate Anticoagulant Adjustment in Patients with High HCT Values." American Journal of Clinical Pathology 2006;126:400-405.)  Activated PTT (APTT) 32.3 (A HCT value >55% may artifactually increase the APTT. In one study, the increase was an average of 19%. Reference: "Effect on Routine and Special Coagulation Testing Values of Citrate Anticoagulant Adjustment in Patients with High HCT Values." American Journal of Clinical Pathology 2006;126:400-405.)  Fibrinogen 214 (A HCT value >55% may artifactually increase the Fibrinogen.  In one study, the increase was an average of 10%. Reference:  "Effect of Routine and Special Coagulation Testing Values of Citrate Anticoagulant Adjustment in Patients with High HCT Values." American Journal of Clinical Pathology 2006;126:400-405.)  Routine Hem:  23-Feb-16 10:17   WBC (CBC)  2.8  RBC (CBC) 4.62  Hemoglobin (CBC) 14.2  Hematocrit (CBC) 44.1  Platelet Count (CBC)  14  MCV 95  MCH 30.6  MCHC 32.1  RDW  18.1  Neutrophil % 94.3  Lymphocyte % 1.8  Monocyte % 3.6  Eosinophil % 0.2  Basophil % 0.1  Neutrophil # 2.6  Lymphocyte #  0.0  Monocyte #  0.1  Eosinophil # 0.0  Basophil # 0.0   EKG:  EKG Interp. by me   Interpretation EKG shows NSR, 70 bpm, left axis deviation, nonspecific inferior st/t changes   Radiology Results: XRay:    23-Feb-16 11:20, Chest Portable Single View  Chest Portable Single View   REASON FOR EXAM:    cough, cancer patient, weakness  COMMENTS:       PROCEDURE: DXR - DXR PORTABLE CHEST SINGLE VIEW  - Jul 26 2014 11:20AM     CLINICAL DATA:  Weakness and confusion.  Tongue  carcinoma    EXAM:  PORTABLE CHEST - 1 VIEW    COMPARISON: PET-CT April 27, 2014    FINDINGS:  There is airspace consolidation in the right upper lobe. There is  atelectasis in the left base. Elsewhere lungs are clear. Heart size  and pulmonary vascularity are normal. No adenopathy. Port-A-Cath tip  is in the superior vena cava. No pneumothorax. There is a healed  fracture of the right clavicle. There are no blastic or lytic bone  lesions.     IMPRESSION:  Right upper lobe airspace consolidation.  Atelectasis left base.      Electronically Signed    By: Lowella Grip III M.D.    On: 07/26/2014 11:56         Verified By: Leafy Kindle. WOODRUFF, M.D.,    Penicillin: GI Distress  Vital Signs/Nurse's Notes:  **Vital Signs.:   23-Feb-16 19:16  Vital Signs Type Admission  Temperature Temperature (F) 97.3  Celsius 36.2  Temperature Source oral  Pulse Pulse 78  Respirations Respirations 16  Systolic BP Systolic BP 366  Diastolic BP (mmHg) Diastolic BP (mmHg) 73  Mean BP 83  Pulse Ox % Pulse Ox % 96  Oxygen Delivery Room Air/ 21 %    Impression 69 year old male with history of IV throat and tongue cancer currently undergoing chemo and radiation and HTN who has been being treated for weakness and dehydration at the [facility] the past 2 days presented to [facility] with right upper lobe PNA, thrombocytopenia of 14, and troponin of 1.70 without chest pain.   1. Elevated troponin: -Of uncertain significance at this time, possibly 2/2 supply demand ischemia 2/2 right upper lobe PNA, dehydration, and malnutrition vs ACS -Currently at 1.70, then 1.8 -With platelet count at 14,000 upon admisson (possibly 2/2 cancerous process and chemo) he is not currently a cath candidate  avoid aspirin and heparin given low platlets -Medically manage -Check echo to evaluate LV function and wall motion  -Change atenolol to Coreg 6.25 mg bid -Check FLP  2. Thrombocypenia: -Received platelet  transfusion this afternoon -Possibly 2/2 cancer and chemo -Continue to monitor -Per IM and Oncology  3. Right upper lobe PNA/possible UTI: -On Levaquin -No SOB, breathing well on RA -Cultures pending -Per IM  4. Malnutrition: -Albumin 1.6 upon admission, likely playing a role in his trace lower extremity swelling -Consider alternative calorie replacement (Ensure)  5. Dehydration/hypernatremia: -Received IV fluids in the ED -Per IM  6. Tongue CA: -Per Oncology  7. Weakness   Electronic Signatures: Christell Faith M (PA-C)  (Signed 23-Feb-16 15:13)  Authored: General Aspect/Present Illness, History and Physical Exam, Review of System, Family & Social History, Past Medical History, Home Medications, Labs, EKG , Radiology, Allergies, Impression/Plan Ida Rogue (MD)  (Signed 23-Feb-16 22:03)  Authored: General Aspect/Present Illness, History and Physical Exam, Review of System, Family & Social History, Health Issues, EKG , Vital Signs/Nurse's Notes, Impression/Plan  Co-Signer: General Aspect/Present Illness, History and Physical Exam, Review of System, Family & Social History, Past Medical History, Home Medications, Labs, EKG , Radiology, Allergies, Impression/Plan   Last Updated: 23-Feb-16 22:03 by Ida Rogue (MD)
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
## 2014-10-02 NOTE — Discharge Summary (Signed)
PATIENT NAME:  Anthony Goodwin, Anthony Goodwin MR#:  [id number] DATE OF BIRTH:  1945-09-10  DATE OF ADMISSION:  07/26/2014 DATE OF DISCHARGE:  08/03/2014  DIAGNOSES AT THE TIME OF DISCHARGE:  1. Septicemia secondary to Pseudomonas pneumonia and urinary tract infection.  2. 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  3. Elevated troponin.  4. Chronic history of tongue cancer, on chemotherapy. 5. Dysphagia.  6. Dehydration and hyponatremia.  7. Decubitus ulcer.   PROCEDURES: None.   CONSULTATIONS: Oncology, palliative care, infectious disease, and cardiology.  BRIEF HISTORY AND HOSPITAL COURSE BASED ON THE PROBLEM: Mr. Anthony Goodwin is a 69 year old, African American male, who came into the ED with a chief complaint of his not eating for 3 days and very weak and frail, unable to walk. Please review the history and physical for details. The patient has history of both tongue and throat cancer, undergoing chemotherapy as well as radiation therapy. The patient was given IV fluids at the oncology office, but the patient was so weak, unable to stand, and has been not eating for 3 days prior to admission. Also, the patient had a 44-pound weight loss. His platelet count was at 14,000. Please review the history and physical for details. The patient was admitted to the hospital, as he was also diagnosed with pneumonia and possible UTI.  1. Septicemia secondary to pneumonia. The patient was diagnosed with septicemia from right middle lobe Pseudomonas pneumonia. Also he was treated for acute cystitis with IV cefepime initially. The patient was followed by infectious disease, Dr. Sampson GoonFitzgerald. After giving IV antibiotics, the patient was switched to p.o. ciprofloxacin. Blood cultures grew Pseudomonas in all 4 out of 4 bottles. Urine cultures also had revealed Pseudomonas, which is sensitive to ciprofloxacin. The patient was continued on p.o. ciprofloxacin during the hospital course.  2. Thrombocytopenia, which is  persistent, likely related to chemotherapy and ongoing tongue and throat cancer. The patient was evaluated by oncology regarding the same. His initial platelet count was at 14,000. He has received several units of platelet transfusion and daily platelet monitoring. Throughout the hospital course, the patient has received approximately 5 to 6 units of single donor platelet transfusion with no significant improvement, but no acute bleeding was noticed during the hospital course.  3. Elevated troponin. The patient was denying any chest pain or shortness of breath. The patient was started on baby aspirin by cardiology. This was thought to be false-positivesecondary to acute kidney injury. IV fluids were provided. Renal function was monitored closely during the hospital course. Nephrotoxins were avoided. The patient was also started on atenolol for cardioprotective action.  4. Regarding the tongue and throat cancer, the patient was followed by hematology/oncology during the hospital course. 5. Dysphagia is thought to be secondary to throat and tongue cancer. Also, the patient had recent radiation. The patient was given Candida esophagitis treatment with Diflucan and he has finished the course. Because of the dysphagia, the patient was having poor p.o. intake and persistently losing weight. He was placed on Megace with no significant improvement. At one point, gastroenterology, Dr. Bluford Kaufmannh, was consulted for PEG tube placement, but in view of severe thrombocytopenia, he was unable to place a PEG tube. Dr. Bluford Kaufmannh from GI has recommended us to reconsult him once the platelet count is greater than 100,000, for PEG placement. Obviously, this was not accomplished during the hospital course. We have considered Dobbhoff as the patient has failure to thrive, but we were unable to place a Dobbhoff tube as the patient's platelet  count needs to be greater than 70,000, which was also not accomplished. In view of septicemia and Pseudomonas,  TPN was not considered. The patient was encouraged to eat, and dietitian consult was placed, but there was no significant improvement in his clinical status. IV fluids were provided for dehydration and electrolytes were corrected. PT consult was also placed for deconditioning, but as his clinical situation is declining with no significant improvement.   We have discussed his clinical situation with the patient and his sister, Ms. Anthony Goodwin. Palliative care was involved. After having several discussions with the patient and his sister, his CODE STATUS has been changed to DNR with comfort care measures. Eventually the patient was transferred to hospice home on March 2 for providing palliative care.   CODE STATUS AT THE TIME OF TRANSFER: DO NOT RESUSCITATE WITH COMFORT CARE.   The patient was transferred to hospice home on March 2.   MEDICATIONS AT THE TIME OF TRANSFER: Oxygen as needed basis. Leave Foley catheter for urinary incontinence and prevention of skin breakdown. Lorazepam 0.5 mg 1 to 2 tablets p.o. sublingually every 2 to 4 hours as needed for anxiety or agitation, morphine 20 mg/mL 0.25 mL p.o. every 1 to 2 hours. The rest of the p.o. medications were discontinued.   LABORATORY DATA: the patient's WBC 5.0, hemoglobin 12.6, hematocrit 28.6, platelets are 27,000. MCV 94.   The patient's Accu-Chek, 74.   The patient and his sister, Ms. Anthony Goodwin, who is also medical power of attorney, is aware of the situation.   Appreciate palliative care support and recommendations during his hospital course.   TOTAL TIME SPENT ON DISCHARGE AND COORDINATION OF CARE: 45 minutes.    ____________________________ Ramonita Lab, MD ag:JT D: 08/10/2014 09:31:30 ET T: 08/10/2014 10:49:25 ET JOB#: [id number]  cc: Ramonita Lab, MD, <Dictator> Ramonita Lab MD ELECTRONICALLY SIGNED 08/16/2014 20:48

## 2016-01-07 IMAGING — CR DG CHEST 1V PORT
1 series · 1 of 1 positions shown · non-contrast
Comparison: PET-CT April 27, 2014

CLINICAL DATA: Weakness and confusion.  Tongue carcinoma

EXAM:
PORTABLE CHEST - 1 VIEW

[ap]
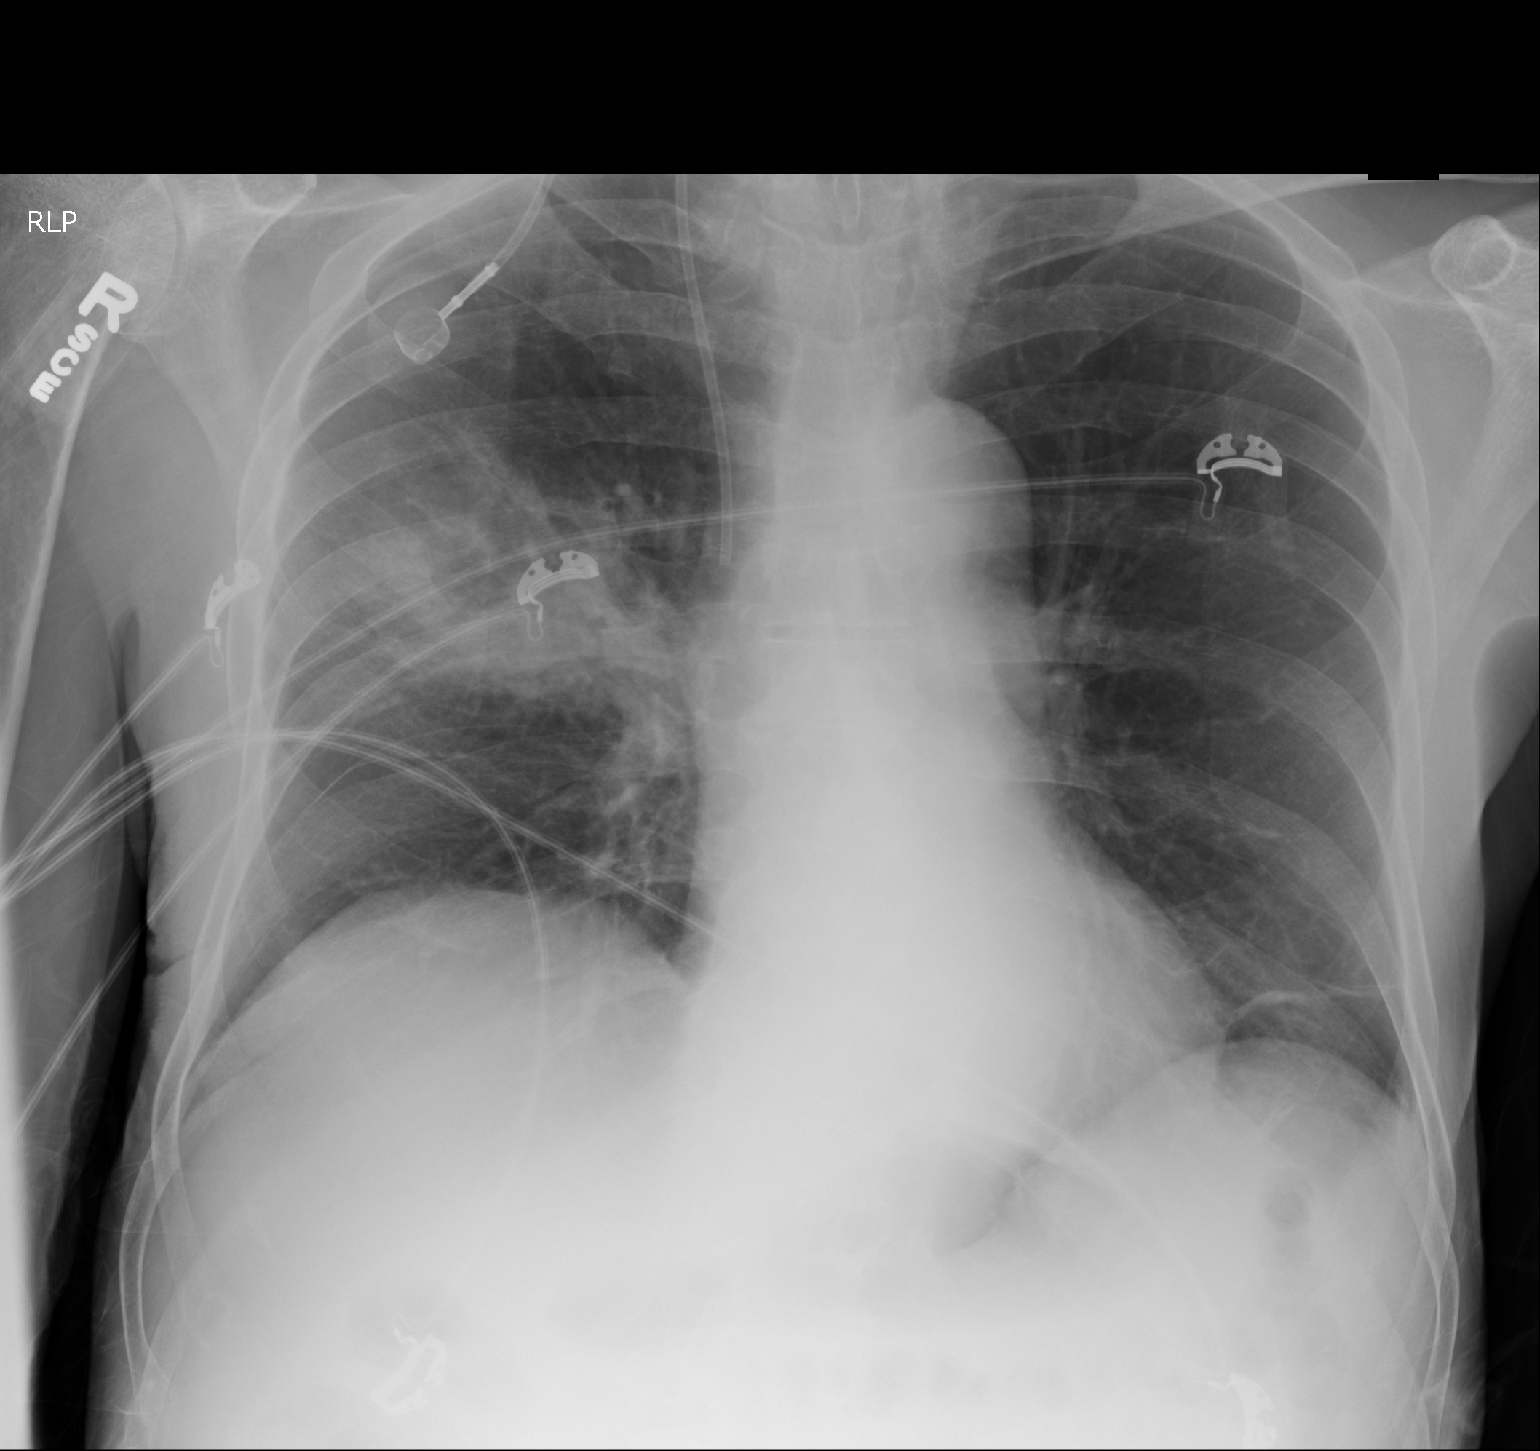

[1 of 1 positions shown; findings below may reference images not displayed]

FINDINGS: There is airspace consolidation in the right upper lobe. There is
atelectasis in the left base. Elsewhere lungs are clear. Heart size
and pulmonary vascularity are normal. No adenopathy. Port-A-Cath tip
is in the superior vena cava. No pneumothorax. There is a healed
fracture of the right clavicle. There are no blastic or lytic bone
lesions.
IMPRESSION: Right upper lobe airspace consolidation.  Atelectasis left base.

## 2016-01-10 IMAGING — CR DG CHEST 2V
1 series · 1 of 1 positions shown · non-contrast
Comparison: 07/26/2014

CLINICAL DATA: Followup right upper lobe infiltrate

EXAM:
CHEST  2 VIEW

[dxr chest pa (or ap) and lateral]
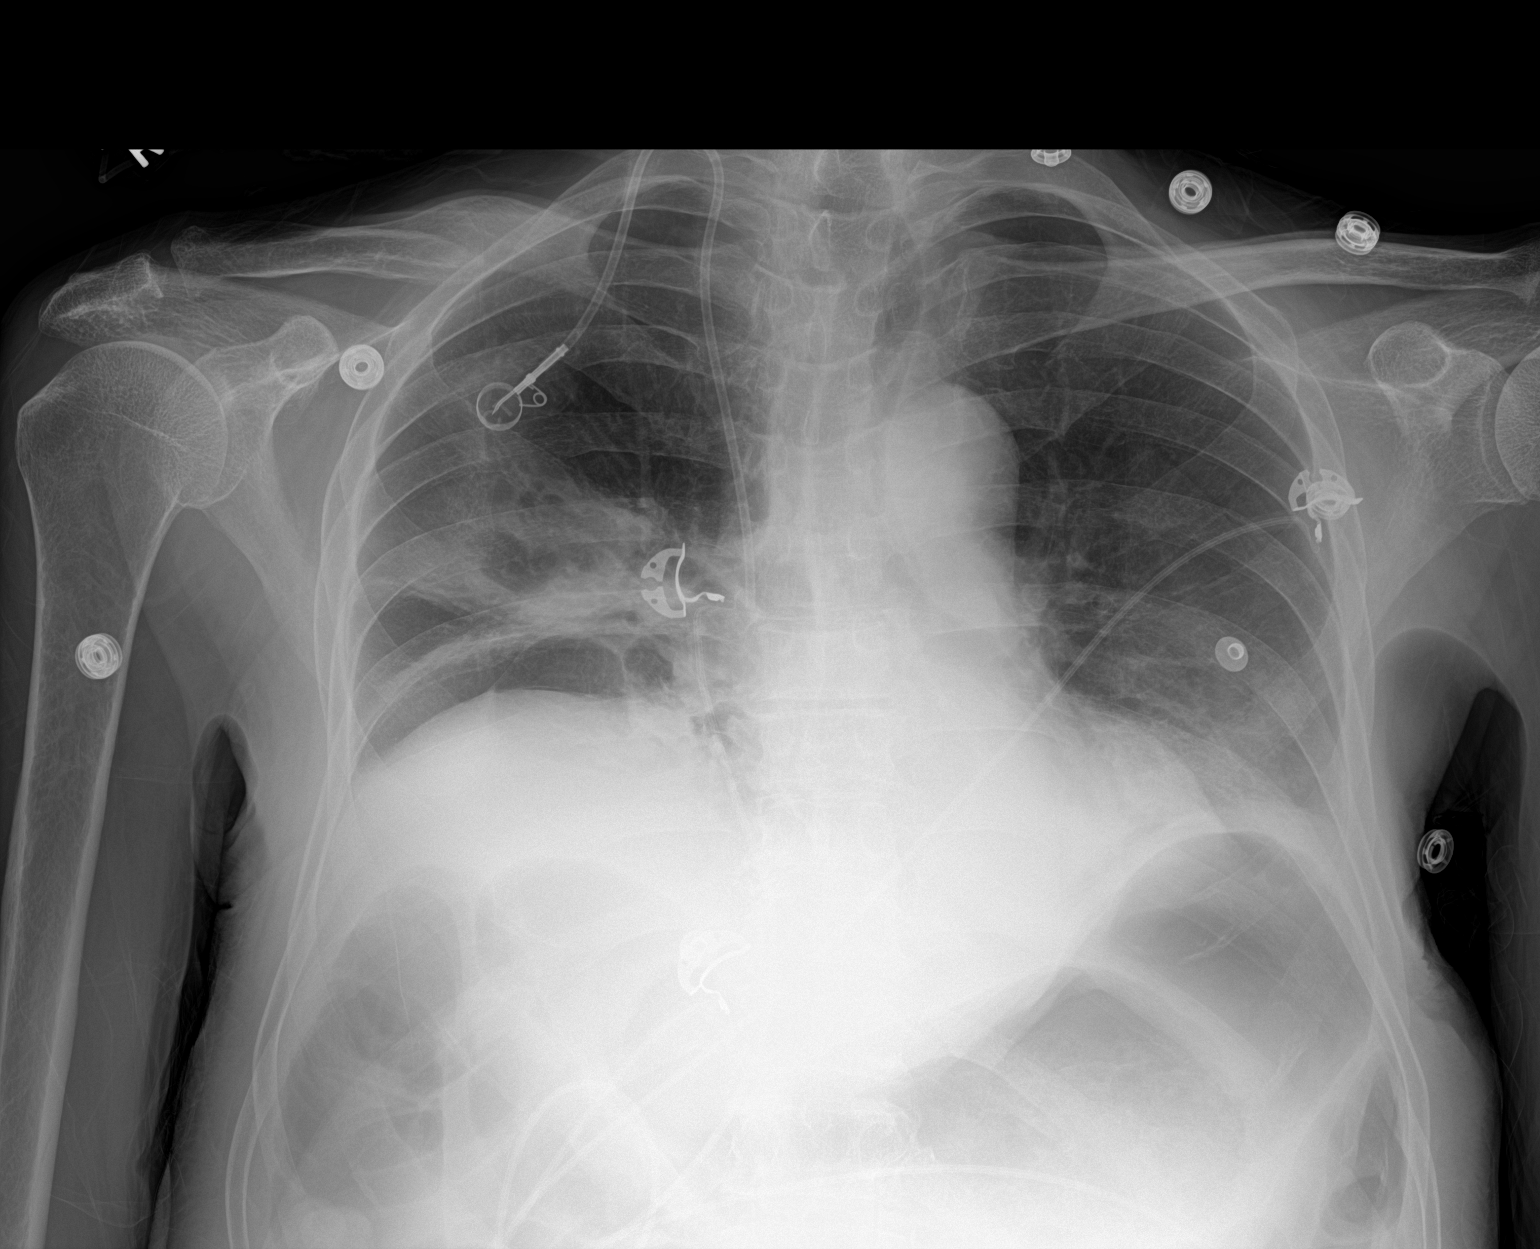

[1 of 1 positions shown; findings below may reference images not displayed]

FINDINGS: Cardiomediastinal silhouette is stable. Study is limited by poor
inspiration. Persistent infiltrate/ consolidation in right upper
lobe perihilar region. No pulmonary edema.
IMPRESSION: Persistent right upper lobe infiltrate/consolidation. Limited study
by poor inspiration. No pulmonary edema.

## 2016-01-15 IMAGING — CR DG CHEST 2V
1 series · 1 of 1 positions shown · non-contrast
Comparison: Chest x-ray 07/29/2014, 07/26/2014, PET-CT 04/27/2014

CLINICAL DATA: 68-year-old male with a history of head and neck
carcinoma.

EXAM:
CHEST - 2 VIEW

[dxr chest pa (or ap) and lateral]
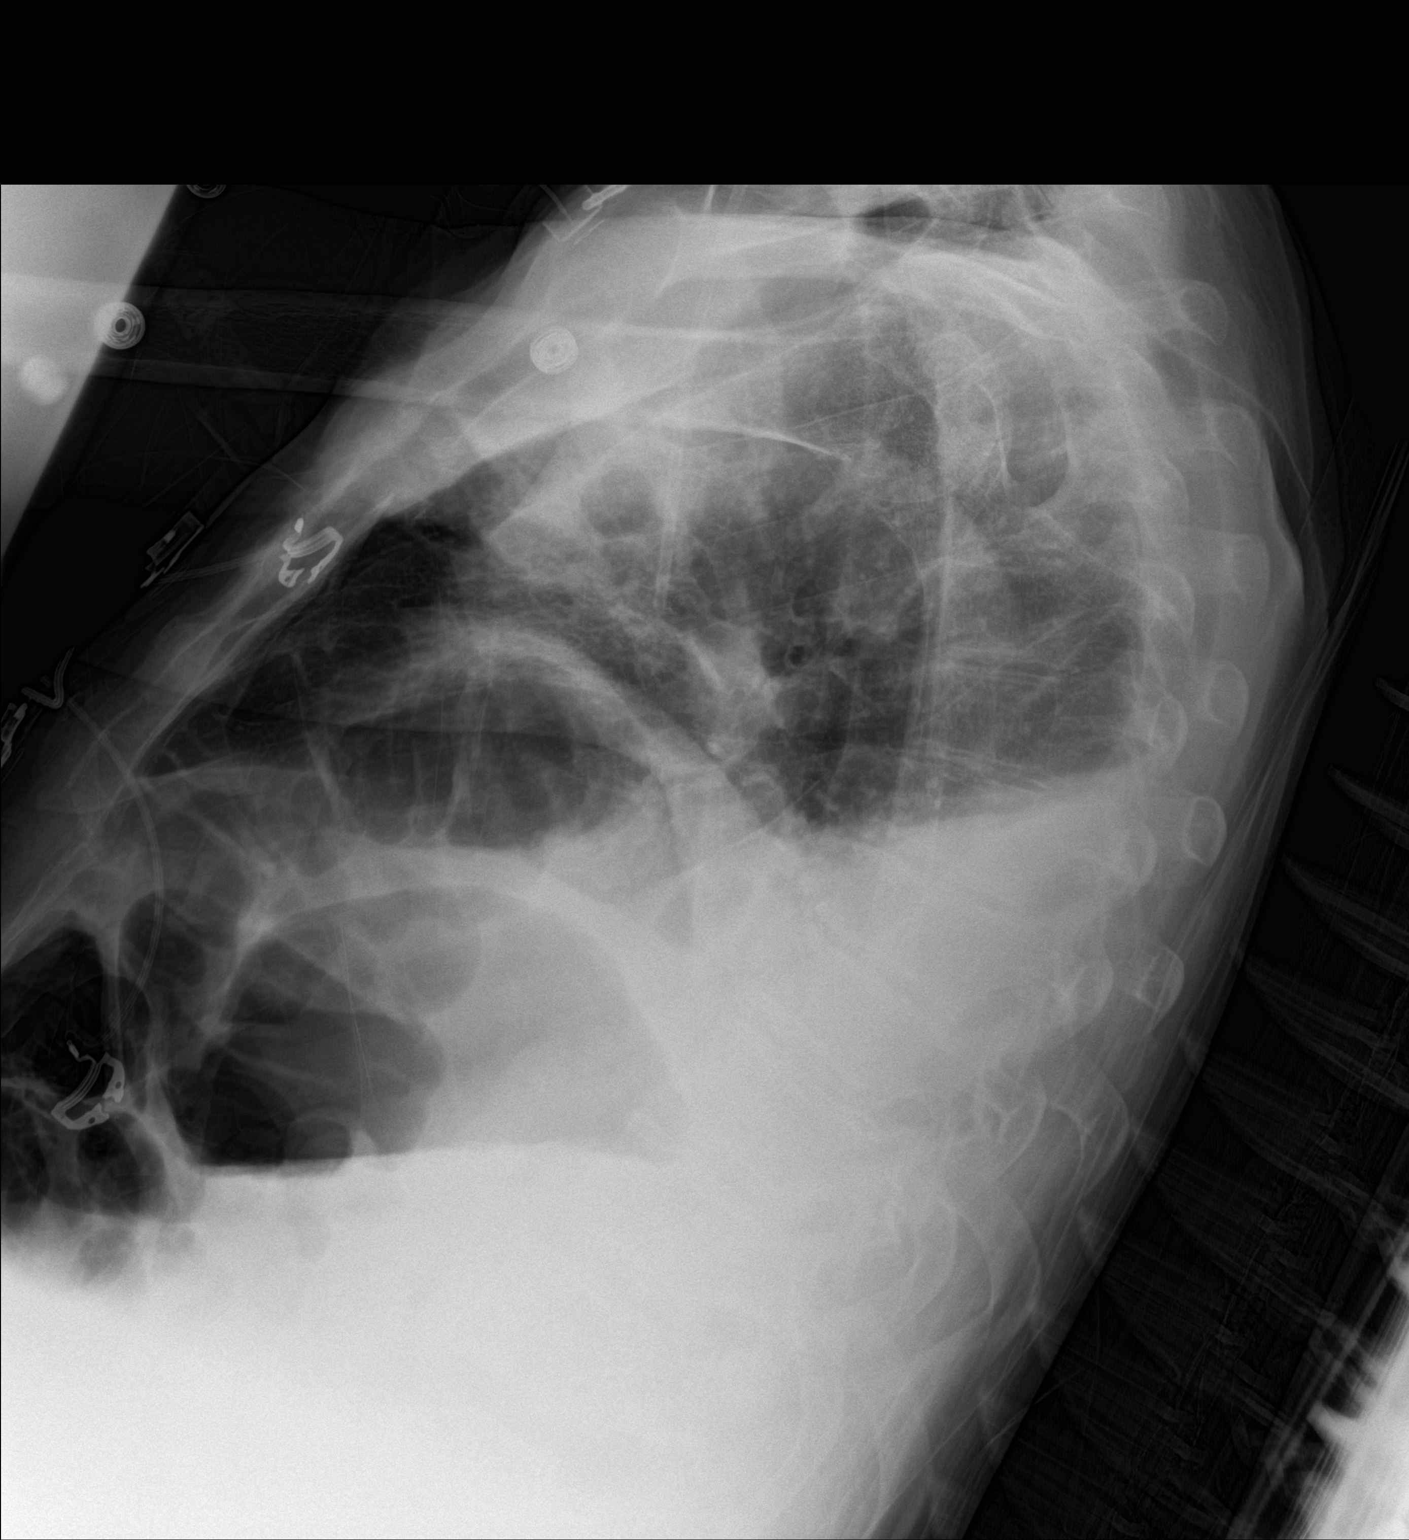

[1 of 1 positions shown; findings below may reference images not displayed]

FINDINGS: Cardiomediastinal silhouette likely unchanged, with prominent
mediastinal with secondary to low lung volumes.

Atherosclerotic calcifications of the aortic arch.

Persistent airspace and interstitial disease of the right upper lobe
with central lucencies, similar to prior. Blunting of the right
cardiophrenic angle. Lateral view demonstrates evidence of bilateral
pleural effusions.

Unchanged position of single-lumen port catheter on the right chest
wall via right internal jugular vein approach.

Gas-filled small bowel and colon

Degenerative changes of the acromioclavicular posttraumatic
deformity of the right clavicle.
IMPRESSION: Persisting consolidation of the right upper lobe with internal
lucencies, suspicious for cavitation. Once the patient has been
treated for this acute process, repeat CT is recommended to assure
the absence of a cavitary mass.

Right greater than left pleural effusions.

Unchanged port catheter

## 2021-01-24 ENCOUNTER — Telehealth: Payer: Self-pay | Admitting: Family Medicine

## 2021-01-24 NOTE — Telephone Encounter (Signed)
Entered in Error.  Everlene Other DO [facility]
